# Patient Record
Sex: Female | Born: 1945 | Race: White | Hispanic: No | Marital: Married | Smoking: Former smoker
Health system: Southern US, Community
[De-identification: ages and names within clinical notes are randomized; demographics above are authoritative.]

## PROBLEM LIST (undated history)

## (undated) DIAGNOSIS — I728 Aneurysm of other specified arteries: Secondary | ICD-10-CM

## (undated) DIAGNOSIS — E079 Disorder of thyroid, unspecified: Secondary | ICD-10-CM

## (undated) DIAGNOSIS — I10 Essential (primary) hypertension: Secondary | ICD-10-CM

## (undated) HISTORY — PX: OTHER SURGICAL HISTORY: SHX169

## (undated) HISTORY — DX: Disorder of thyroid, unspecified: E07.9

## (undated) HISTORY — PX: AUGMENTATION MAMMAPLASTY: SUR837

## (undated) HISTORY — DX: Essential (primary) hypertension: I10

## (undated) HISTORY — DX: Aneurysm of other specified arteries: I72.8

## (undated) HISTORY — PX: TONSILLECTOMY: SUR1361

## (undated) HISTORY — PX: ABDOMINAL HYSTERECTOMY: SHX81

---

## 1998-11-05 ENCOUNTER — Other Ambulatory Visit: Admission: RE | Admit: 1998-11-05 | Discharge: 1998-11-05 | Payer: Self-pay | Admitting: Obstetrics and Gynecology

## 1999-01-28 ENCOUNTER — Encounter: Payer: Self-pay | Admitting: Specialist

## 1999-01-28 ENCOUNTER — Ambulatory Visit (HOSPITAL_COMMUNITY): Admission: RE | Admit: 1999-01-28 | Discharge: 1999-01-28 | Payer: Self-pay | Admitting: Specialist

## 2006-11-02 ENCOUNTER — Ambulatory Visit (HOSPITAL_COMMUNITY): Admission: RE | Admit: 2006-11-02 | Discharge: 2006-11-02 | Payer: Self-pay | Admitting: Internal Medicine

## 2011-10-20 DIAGNOSIS — G5760 Lesion of plantar nerve, unspecified lower limb: Secondary | ICD-10-CM | POA: Diagnosis not present

## 2011-11-03 DIAGNOSIS — G5760 Lesion of plantar nerve, unspecified lower limb: Secondary | ICD-10-CM | POA: Diagnosis not present

## 2011-11-19 DIAGNOSIS — Z23 Encounter for immunization: Secondary | ICD-10-CM | POA: Diagnosis not present

## 2011-11-19 DIAGNOSIS — M810 Age-related osteoporosis without current pathological fracture: Secondary | ICD-10-CM | POA: Diagnosis not present

## 2011-11-19 DIAGNOSIS — E559 Vitamin D deficiency, unspecified: Secondary | ICD-10-CM | POA: Diagnosis not present

## 2011-11-19 DIAGNOSIS — E039 Hypothyroidism, unspecified: Secondary | ICD-10-CM | POA: Diagnosis not present

## 2011-11-19 DIAGNOSIS — Z0000 Encounter for general adult medical examination without abnormal findings: Secondary | ICD-10-CM | POA: Diagnosis not present

## 2011-11-19 DIAGNOSIS — Z124 Encounter for screening for malignant neoplasm of cervix: Secondary | ICD-10-CM | POA: Diagnosis not present

## 2011-11-19 DIAGNOSIS — I10 Essential (primary) hypertension: Secondary | ICD-10-CM | POA: Diagnosis not present

## 2011-12-01 DIAGNOSIS — G5760 Lesion of plantar nerve, unspecified lower limb: Secondary | ICD-10-CM | POA: Diagnosis not present

## 2011-12-17 DIAGNOSIS — M810 Age-related osteoporosis without current pathological fracture: Secondary | ICD-10-CM | POA: Diagnosis not present

## 2012-01-18 DIAGNOSIS — R509 Fever, unspecified: Secondary | ICD-10-CM | POA: Diagnosis not present

## 2012-01-18 DIAGNOSIS — J019 Acute sinusitis, unspecified: Secondary | ICD-10-CM | POA: Diagnosis not present

## 2012-02-03 DIAGNOSIS — Z6825 Body mass index (BMI) 25.0-25.9, adult: Secondary | ICD-10-CM | POA: Diagnosis not present

## 2012-02-03 DIAGNOSIS — J019 Acute sinusitis, unspecified: Secondary | ICD-10-CM | POA: Diagnosis not present

## 2012-02-03 DIAGNOSIS — H6120 Impacted cerumen, unspecified ear: Secondary | ICD-10-CM | POA: Diagnosis not present

## 2012-02-03 DIAGNOSIS — E039 Hypothyroidism, unspecified: Secondary | ICD-10-CM | POA: Diagnosis not present

## 2012-02-03 DIAGNOSIS — H902 Conductive hearing loss, unspecified: Secondary | ICD-10-CM | POA: Diagnosis not present

## 2012-03-01 DIAGNOSIS — K644 Residual hemorrhoidal skin tags: Secondary | ICD-10-CM | POA: Diagnosis not present

## 2012-03-30 DIAGNOSIS — Z1211 Encounter for screening for malignant neoplasm of colon: Secondary | ICD-10-CM | POA: Diagnosis not present

## 2012-03-30 DIAGNOSIS — I059 Rheumatic mitral valve disease, unspecified: Secondary | ICD-10-CM | POA: Diagnosis not present

## 2012-03-30 DIAGNOSIS — Z79899 Other long term (current) drug therapy: Secondary | ICD-10-CM | POA: Diagnosis not present

## 2012-03-30 DIAGNOSIS — Z87891 Personal history of nicotine dependence: Secondary | ICD-10-CM | POA: Diagnosis not present

## 2012-03-30 DIAGNOSIS — Z85828 Personal history of other malignant neoplasm of skin: Secondary | ICD-10-CM | POA: Diagnosis not present

## 2012-03-30 DIAGNOSIS — E079 Disorder of thyroid, unspecified: Secondary | ICD-10-CM | POA: Diagnosis not present

## 2012-03-30 DIAGNOSIS — Z7982 Long term (current) use of aspirin: Secondary | ICD-10-CM | POA: Diagnosis not present

## 2012-03-30 DIAGNOSIS — I10 Essential (primary) hypertension: Secondary | ICD-10-CM | POA: Diagnosis not present

## 2012-05-31 DIAGNOSIS — E785 Hyperlipidemia, unspecified: Secondary | ICD-10-CM | POA: Diagnosis not present

## 2012-05-31 DIAGNOSIS — E039 Hypothyroidism, unspecified: Secondary | ICD-10-CM | POA: Diagnosis not present

## 2012-05-31 DIAGNOSIS — I10 Essential (primary) hypertension: Secondary | ICD-10-CM | POA: Diagnosis not present

## 2012-05-31 DIAGNOSIS — Z6825 Body mass index (BMI) 25.0-25.9, adult: Secondary | ICD-10-CM | POA: Diagnosis not present

## 2012-12-01 DIAGNOSIS — E559 Vitamin D deficiency, unspecified: Secondary | ICD-10-CM | POA: Diagnosis not present

## 2012-12-01 DIAGNOSIS — Z0000 Encounter for general adult medical examination without abnormal findings: Secondary | ICD-10-CM | POA: Diagnosis not present

## 2012-12-27 DIAGNOSIS — M810 Age-related osteoporosis without current pathological fracture: Secondary | ICD-10-CM | POA: Diagnosis not present

## 2013-01-12 DIAGNOSIS — R5381 Other malaise: Secondary | ICD-10-CM | POA: Diagnosis not present

## 2013-01-12 DIAGNOSIS — Z1331 Encounter for screening for depression: Secondary | ICD-10-CM | POA: Diagnosis not present

## 2013-01-12 DIAGNOSIS — R5383 Other fatigue: Secondary | ICD-10-CM | POA: Diagnosis not present

## 2013-01-12 DIAGNOSIS — Z6826 Body mass index (BMI) 26.0-26.9, adult: Secondary | ICD-10-CM | POA: Diagnosis not present

## 2013-01-12 DIAGNOSIS — I10 Essential (primary) hypertension: Secondary | ICD-10-CM | POA: Diagnosis not present

## 2013-01-12 DIAGNOSIS — Z9181 History of falling: Secondary | ICD-10-CM | POA: Diagnosis not present

## 2013-01-28 DIAGNOSIS — H18419 Arcus senilis, unspecified eye: Secondary | ICD-10-CM | POA: Diagnosis not present

## 2013-01-28 DIAGNOSIS — H2510 Age-related nuclear cataract, unspecified eye: Secondary | ICD-10-CM | POA: Diagnosis not present

## 2013-01-28 DIAGNOSIS — H25049 Posterior subcapsular polar age-related cataract, unspecified eye: Secondary | ICD-10-CM | POA: Diagnosis not present

## 2013-01-28 DIAGNOSIS — H25019 Cortical age-related cataract, unspecified eye: Secondary | ICD-10-CM | POA: Diagnosis not present

## 2013-01-28 DIAGNOSIS — H02839 Dermatochalasis of unspecified eye, unspecified eyelid: Secondary | ICD-10-CM | POA: Diagnosis not present

## 2013-02-16 DIAGNOSIS — Z85828 Personal history of other malignant neoplasm of skin: Secondary | ICD-10-CM | POA: Diagnosis not present

## 2013-02-28 DIAGNOSIS — H269 Unspecified cataract: Secondary | ICD-10-CM | POA: Diagnosis not present

## 2013-02-28 DIAGNOSIS — H2510 Age-related nuclear cataract, unspecified eye: Secondary | ICD-10-CM | POA: Diagnosis not present

## 2013-03-01 DIAGNOSIS — H2510 Age-related nuclear cataract, unspecified eye: Secondary | ICD-10-CM | POA: Diagnosis not present

## 2013-03-21 DIAGNOSIS — H2510 Age-related nuclear cataract, unspecified eye: Secondary | ICD-10-CM | POA: Diagnosis not present

## 2013-03-21 DIAGNOSIS — H269 Unspecified cataract: Secondary | ICD-10-CM | POA: Diagnosis not present

## 2013-04-27 DIAGNOSIS — E559 Vitamin D deficiency, unspecified: Secondary | ICD-10-CM | POA: Diagnosis not present

## 2013-04-27 DIAGNOSIS — I10 Essential (primary) hypertension: Secondary | ICD-10-CM | POA: Diagnosis not present

## 2013-04-27 DIAGNOSIS — Z6826 Body mass index (BMI) 26.0-26.9, adult: Secondary | ICD-10-CM | POA: Diagnosis not present

## 2013-04-27 DIAGNOSIS — E785 Hyperlipidemia, unspecified: Secondary | ICD-10-CM | POA: Diagnosis not present

## 2013-07-19 DIAGNOSIS — R002 Palpitations: Secondary | ICD-10-CM | POA: Diagnosis not present

## 2013-07-19 DIAGNOSIS — Z79899 Other long term (current) drug therapy: Secondary | ICD-10-CM | POA: Diagnosis not present

## 2013-07-19 DIAGNOSIS — Z7982 Long term (current) use of aspirin: Secondary | ICD-10-CM | POA: Diagnosis not present

## 2013-07-19 DIAGNOSIS — R079 Chest pain, unspecified: Secondary | ICD-10-CM | POA: Diagnosis not present

## 2013-07-19 DIAGNOSIS — R072 Precordial pain: Secondary | ICD-10-CM | POA: Diagnosis not present

## 2013-07-19 DIAGNOSIS — E039 Hypothyroidism, unspecified: Secondary | ICD-10-CM | POA: Diagnosis not present

## 2013-07-19 DIAGNOSIS — M810 Age-related osteoporosis without current pathological fracture: Secondary | ICD-10-CM | POA: Diagnosis not present

## 2013-07-19 DIAGNOSIS — Z0389 Encounter for observation for other suspected diseases and conditions ruled out: Secondary | ICD-10-CM | POA: Diagnosis not present

## 2013-07-19 DIAGNOSIS — I10 Essential (primary) hypertension: Secondary | ICD-10-CM | POA: Diagnosis not present

## 2013-07-20 DIAGNOSIS — I369 Nonrheumatic tricuspid valve disorder, unspecified: Secondary | ICD-10-CM | POA: Diagnosis not present

## 2013-07-20 DIAGNOSIS — I359 Nonrheumatic aortic valve disorder, unspecified: Secondary | ICD-10-CM | POA: Diagnosis not present

## 2013-07-20 DIAGNOSIS — R002 Palpitations: Secondary | ICD-10-CM | POA: Diagnosis not present

## 2013-07-20 DIAGNOSIS — R0789 Other chest pain: Secondary | ICD-10-CM | POA: Diagnosis not present

## 2013-07-20 DIAGNOSIS — E039 Hypothyroidism, unspecified: Secondary | ICD-10-CM | POA: Diagnosis not present

## 2013-07-20 DIAGNOSIS — I059 Rheumatic mitral valve disease, unspecified: Secondary | ICD-10-CM | POA: Diagnosis not present

## 2013-07-20 DIAGNOSIS — R079 Chest pain, unspecified: Secondary | ICD-10-CM | POA: Diagnosis not present

## 2013-07-20 DIAGNOSIS — I10 Essential (primary) hypertension: Secondary | ICD-10-CM | POA: Diagnosis not present

## 2013-07-25 DIAGNOSIS — I10 Essential (primary) hypertension: Secondary | ICD-10-CM | POA: Diagnosis not present

## 2013-07-25 DIAGNOSIS — E039 Hypothyroidism, unspecified: Secondary | ICD-10-CM | POA: Diagnosis not present

## 2013-07-25 DIAGNOSIS — R002 Palpitations: Secondary | ICD-10-CM | POA: Diagnosis not present

## 2013-07-25 DIAGNOSIS — Z6826 Body mass index (BMI) 26.0-26.9, adult: Secondary | ICD-10-CM | POA: Diagnosis not present

## 2013-07-25 DIAGNOSIS — R079 Chest pain, unspecified: Secondary | ICD-10-CM | POA: Diagnosis not present

## 2013-08-05 DIAGNOSIS — R002 Palpitations: Secondary | ICD-10-CM | POA: Diagnosis not present

## 2013-08-08 DIAGNOSIS — Z6826 Body mass index (BMI) 26.0-26.9, adult: Secondary | ICD-10-CM | POA: Diagnosis not present

## 2013-08-08 DIAGNOSIS — R002 Palpitations: Secondary | ICD-10-CM | POA: Diagnosis not present

## 2013-08-08 DIAGNOSIS — K219 Gastro-esophageal reflux disease without esophagitis: Secondary | ICD-10-CM | POA: Diagnosis not present

## 2013-09-07 DIAGNOSIS — J069 Acute upper respiratory infection, unspecified: Secondary | ICD-10-CM | POA: Diagnosis not present

## 2013-09-07 DIAGNOSIS — Z6827 Body mass index (BMI) 27.0-27.9, adult: Secondary | ICD-10-CM | POA: Diagnosis not present

## 2013-09-07 DIAGNOSIS — R002 Palpitations: Secondary | ICD-10-CM | POA: Diagnosis not present

## 2013-09-19 ENCOUNTER — Telehealth: Payer: Self-pay | Admitting: Vascular Surgery

## 2013-09-19 DIAGNOSIS — K7689 Other specified diseases of liver: Secondary | ICD-10-CM | POA: Diagnosis not present

## 2013-09-19 DIAGNOSIS — E039 Hypothyroidism, unspecified: Secondary | ICD-10-CM | POA: Diagnosis not present

## 2013-09-19 DIAGNOSIS — I10 Essential (primary) hypertension: Secondary | ICD-10-CM | POA: Diagnosis not present

## 2013-09-19 DIAGNOSIS — R05 Cough: Secondary | ICD-10-CM | POA: Diagnosis not present

## 2013-09-19 DIAGNOSIS — R0602 Shortness of breath: Secondary | ICD-10-CM | POA: Diagnosis not present

## 2013-09-19 DIAGNOSIS — I728 Aneurysm of other specified arteries: Secondary | ICD-10-CM | POA: Diagnosis not present

## 2013-09-19 DIAGNOSIS — R51 Headache: Secondary | ICD-10-CM | POA: Diagnosis not present

## 2013-09-19 DIAGNOSIS — R112 Nausea with vomiting, unspecified: Secondary | ICD-10-CM | POA: Diagnosis not present

## 2013-09-19 DIAGNOSIS — K8020 Calculus of gallbladder without cholecystitis without obstruction: Secondary | ICD-10-CM | POA: Diagnosis not present

## 2013-09-19 DIAGNOSIS — R918 Other nonspecific abnormal finding of lung field: Secondary | ICD-10-CM | POA: Diagnosis not present

## 2013-09-19 DIAGNOSIS — R1900 Intra-abdominal and pelvic swelling, mass and lump, unspecified site: Secondary | ICD-10-CM | POA: Diagnosis not present

## 2013-09-19 DIAGNOSIS — Z79899 Other long term (current) drug therapy: Secondary | ICD-10-CM | POA: Diagnosis not present

## 2013-09-19 NOTE — Telephone Encounter (Signed)
Spoke with Dr Ilsa Iha at [facility] regarding pt Kristen Holden.  DOB 08/15/2046.  Home phone [phone].  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  Will schedule outpt visit with me in the next 4-6 weeks.  She will need a follow up CT in one year.  Fabienne Bruns, MD [facility] [place] Office: [phone] Pager: [phone]

## 2013-09-21 ENCOUNTER — Telehealth: Payer: Self-pay | Admitting: Vascular Surgery

## 2013-09-21 NOTE — Telephone Encounter (Addendum)
Message copied by Fredrich Birks on Wed Sep 21, 2013 11:16 AM ------      Message from: Fabienne Bruns E      Created: Mon Sep 19, 2013  2:20 PM       Called by [facility] about this pt with 1 cm splenic artery aneurysm.  She needs to bring a copy of the CT images on a CD as well as the report to office visit in 4-6 weeks.  She does not need a repeat CT.  Best number is [phone] home number            Leonette Most ------  09/21/13: spoke with pt and mailed letter, dpm

## 2013-10-19 ENCOUNTER — Encounter: Payer: Self-pay | Admitting: Vascular Surgery

## 2013-10-20 ENCOUNTER — Ambulatory Visit (INDEPENDENT_AMBULATORY_CARE_PROVIDER_SITE_OTHER): Payer: Medicare Other | Admitting: Vascular Surgery

## 2013-10-20 ENCOUNTER — Encounter: Payer: Self-pay | Admitting: Vascular Surgery

## 2013-10-20 VITALS — BP 141/60 | HR 41 | Resp 16 | Ht 67.5 in | Wt 171.0 lb

## 2013-10-20 DIAGNOSIS — I728 Aneurysm of other specified arteries: Secondary | ICD-10-CM | POA: Diagnosis not present

## 2013-10-20 NOTE — Progress Notes (Signed)
[facility] [place] HISTORY AND PHYSICAL   History of Present Illness:  Patient is a 68 y.o. year old female who presents for evaluation of splenic artery aneurysm. The patient was recently seen for an episode of abdominal pain nausea and vomiting at [facility]. CT scan of abdomen and pelvis were obtained which showed possible splenic artery aneurysm. She denies any family history of aneurysms. She currently has no abdominal pain. The nausea and vomiting symptoms that she had previously had resolved. Other medical problems include hypertension and thyroid disease. These are currently stable. She is a former smoker and quit 17 years ago.  Past Medical History  Diagnosis Date  . Hypertension   . Thyroid disease     Past Surgical History  Procedure Laterality Date  . Abdominal hysterectomy    . Breast augumentation Bilateral   . Tonsillectomy      Social History History  Substance Use Topics  . Smoking status: Former Smoker    Quit date: 09/22/1996  . Smokeless tobacco: Not on file  . Alcohol Use: Yes     Comment: 1 drink/ week    Family History Family History  Problem Relation Age of Onset  . Diabetes Mother   . Hypertension Mother   . Varicose Veins Mother   . Cancer Father   . Hypertension Father     Allergies  No Known Allergies   Current Outpatient Prescriptions  Medication Sig Dispense Refill  . aspirin 81 MG tablet Take 81 mg by mouth daily.      . hydrochlorothiazide (HYDRODIURIL) 12.5 MG tablet Take 12.5 mg by mouth daily.      Marland Kitchen levothyroxine (SYNTHROID, LEVOTHROID) 88 MCG tablet Take 88 mcg by mouth daily before breakfast.      . sertraline (ZOLOFT) 100 MG tablet Take 100 mg by mouth daily.      . Vitamin D, Ergocalciferol, (DRISDOL) 50000 UNITS CAPS capsule Take 50,000 Units by mouth every 7 (seven) days.       No current facility-administered medications for this visit.    ROS:   General:  No weight loss, Fever,  chills  HEENT: No recent headaches, no nasal bleeding, no visual changes, no sore throat  Neurologic: No dizziness, blackouts, seizures. No recent symptoms of stroke or mini- stroke. No recent episodes of slurred speech, or temporary blindness.  Cardiac: No recent episodes of chest pain/pressure, no shortness of breath at rest.  No shortness of breath with exertion.  Denies history of atrial fibrillation or irregular heartbeat  Vascular: No history of rest pain in feet.  No history of claudication.  No history of non-healing ulcer, No history of DVT   Pulmonary: No home oxygen, no productive cough, no hemoptysis,  No asthma or wheezing  Musculoskeletal:  [ ]  Arthritis, [ ]  Low back pain,  [ ]  Joint pain  Hematologic:No history of hypercoagulable state.  No history of easy bleeding.  No history of anemia  Gastrointestinal: No hematochezia or melena,  No gastroesophageal reflux, no trouble swallowing  Urinary: [ ]  chronic Kidney disease, [ ]  on HD - [ ]  MWF or [ ]  TTHS, [ ]  Burning with urination, [ ]  Frequent urination, [ ]  Difficulty urinating;   Skin: No rashes  Psychological: No history of anxiety,  No history of depression   Physical Examination  Filed Vitals:   10/20/13 1306  BP: 141/60  Pulse: 41  Resp: 16  Height: 5' 7.5" (1.715 m)  Weight: 171 lb (77.565 kg)  Body mass index is 26.37 kg/(m^2).  General:  Alert and oriented, no acute distress HEENT: Normal Neck: No bruit or JVD Pulmonary: Clear to auscultation bilaterally Cardiac: Regular Rate and Rhythm without murmur Abdomen: Soft, non-tender, non-distended, no mass Skin: No rash Extremity Pulses:  2+ radial, brachial, femoral pulses bilaterally Musculoskeletal: No deformity or edema  Neurologic: Upper and lower extremity motor 5/5 and symmetric  DATA:  CT scan of abdomen and pelvis runoff hospital was reviewed. There are 2 1 cm calcified areas in the hilum of the spleen most likely consistent with splenic  artery aneurysm. One of these has a subtotal occlusion. There is contrast flowing through the other one. There are each 1 cm in diameter. The aorta is of normal diameter.   ASSESSMENT:  Asymptomatic 1 cm splenic artery aneurysm   PLAN:  Followup CT scan in 2 years for evaluation to make sure there is no expansion of the aneurysm. If the aneurysm ever gets to greater than 2 cm we would consider repair at that point. The patient also has a history of bradycardia. She has been evaluated by her primary care physician for this. I discussed with her that she should continue to follow up with her primary care physician regarding this.  Ruta Hinds, MD [facility] [place] Office: [phone] Pager: [phone]

## 2013-12-07 DIAGNOSIS — Z6826 Body mass index (BMI) 26.0-26.9, adult: Secondary | ICD-10-CM | POA: Diagnosis not present

## 2013-12-07 DIAGNOSIS — I10 Essential (primary) hypertension: Secondary | ICD-10-CM | POA: Diagnosis not present

## 2013-12-07 DIAGNOSIS — K219 Gastro-esophageal reflux disease without esophagitis: Secondary | ICD-10-CM | POA: Diagnosis not present

## 2013-12-07 DIAGNOSIS — R002 Palpitations: Secondary | ICD-10-CM | POA: Diagnosis not present

## 2014-01-11 DIAGNOSIS — J309 Allergic rhinitis, unspecified: Secondary | ICD-10-CM | POA: Diagnosis not present

## 2014-01-11 DIAGNOSIS — J019 Acute sinusitis, unspecified: Secondary | ICD-10-CM | POA: Diagnosis not present

## 2014-01-11 DIAGNOSIS — Z6825 Body mass index (BMI) 25.0-25.9, adult: Secondary | ICD-10-CM | POA: Diagnosis not present

## 2014-02-01 DIAGNOSIS — IMO0002 Reserved for concepts with insufficient information to code with codable children: Secondary | ICD-10-CM | POA: Diagnosis not present

## 2014-02-01 DIAGNOSIS — J180 Bronchopneumonia, unspecified organism: Secondary | ICD-10-CM | POA: Diagnosis not present

## 2014-03-13 DIAGNOSIS — Z23 Encounter for immunization: Secondary | ICD-10-CM | POA: Diagnosis not present

## 2014-03-13 DIAGNOSIS — E785 Hyperlipidemia, unspecified: Secondary | ICD-10-CM | POA: Diagnosis not present

## 2014-03-13 DIAGNOSIS — Z0000 Encounter for general adult medical examination without abnormal findings: Secondary | ICD-10-CM | POA: Diagnosis not present

## 2014-03-13 DIAGNOSIS — I10 Essential (primary) hypertension: Secondary | ICD-10-CM | POA: Diagnosis not present

## 2014-03-13 DIAGNOSIS — Z1331 Encounter for screening for depression: Secondary | ICD-10-CM | POA: Diagnosis not present

## 2014-03-13 DIAGNOSIS — IMO0002 Reserved for concepts with insufficient information to code with codable children: Secondary | ICD-10-CM | POA: Diagnosis not present

## 2014-03-13 DIAGNOSIS — E559 Vitamin D deficiency, unspecified: Secondary | ICD-10-CM | POA: Diagnosis not present

## 2014-03-13 DIAGNOSIS — Z9181 History of falling: Secondary | ICD-10-CM | POA: Diagnosis not present

## 2014-03-13 DIAGNOSIS — Z1212 Encounter for screening for malignant neoplasm of rectum: Secondary | ICD-10-CM | POA: Diagnosis not present

## 2014-05-19 DIAGNOSIS — Z6825 Body mass index (BMI) 25.0-25.9, adult: Secondary | ICD-10-CM | POA: Diagnosis not present

## 2014-05-19 DIAGNOSIS — J019 Acute sinusitis, unspecified: Secondary | ICD-10-CM | POA: Diagnosis not present

## 2014-05-19 DIAGNOSIS — H9209 Otalgia, unspecified ear: Secondary | ICD-10-CM | POA: Diagnosis not present

## 2014-05-19 DIAGNOSIS — H6120 Impacted cerumen, unspecified ear: Secondary | ICD-10-CM | POA: Diagnosis not present

## 2014-07-17 DIAGNOSIS — K219 Gastro-esophageal reflux disease without esophagitis: Secondary | ICD-10-CM | POA: Diagnosis not present

## 2014-07-17 DIAGNOSIS — R5381 Other malaise: Secondary | ICD-10-CM | POA: Diagnosis not present

## 2014-07-17 DIAGNOSIS — E559 Vitamin D deficiency, unspecified: Secondary | ICD-10-CM | POA: Diagnosis not present

## 2014-07-17 DIAGNOSIS — J309 Allergic rhinitis, unspecified: Secondary | ICD-10-CM | POA: Diagnosis not present

## 2014-07-17 DIAGNOSIS — M810 Age-related osteoporosis without current pathological fracture: Secondary | ICD-10-CM | POA: Diagnosis not present

## 2014-07-17 DIAGNOSIS — I10 Essential (primary) hypertension: Secondary | ICD-10-CM | POA: Diagnosis not present

## 2014-07-17 DIAGNOSIS — E785 Hyperlipidemia, unspecified: Secondary | ICD-10-CM | POA: Diagnosis not present

## 2014-07-17 DIAGNOSIS — E039 Hypothyroidism, unspecified: Secondary | ICD-10-CM | POA: Diagnosis not present

## 2014-09-25 DIAGNOSIS — Z6826 Body mass index (BMI) 26.0-26.9, adult: Secondary | ICD-10-CM | POA: Diagnosis not present

## 2014-09-25 DIAGNOSIS — J209 Acute bronchitis, unspecified: Secondary | ICD-10-CM | POA: Diagnosis not present

## 2014-10-06 DIAGNOSIS — Z6826 Body mass index (BMI) 26.0-26.9, adult: Secondary | ICD-10-CM | POA: Diagnosis not present

## 2014-10-06 DIAGNOSIS — J209 Acute bronchitis, unspecified: Secondary | ICD-10-CM | POA: Diagnosis not present

## 2014-10-10 DIAGNOSIS — J209 Acute bronchitis, unspecified: Secondary | ICD-10-CM | POA: Diagnosis not present

## 2014-10-10 DIAGNOSIS — Z6826 Body mass index (BMI) 26.0-26.9, adult: Secondary | ICD-10-CM | POA: Diagnosis not present

## 2014-10-19 ENCOUNTER — Encounter: Payer: Self-pay | Admitting: Family

## 2014-10-23 ENCOUNTER — Ambulatory Visit: Payer: Medicare Other | Admitting: Family

## 2015-02-20 DIAGNOSIS — R0602 Shortness of breath: Secondary | ICD-10-CM | POA: Diagnosis not present

## 2015-02-20 DIAGNOSIS — Z6826 Body mass index (BMI) 26.0-26.9, adult: Secondary | ICD-10-CM | POA: Diagnosis not present

## 2015-02-20 DIAGNOSIS — J208 Acute bronchitis due to other specified organisms: Secondary | ICD-10-CM | POA: Diagnosis not present

## 2015-02-28 DIAGNOSIS — Z0279 Encounter for issue of other medical certificate: Secondary | ICD-10-CM

## 2015-03-07 ENCOUNTER — Ambulatory Visit: Payer: Medicare Other | Admitting: Family

## 2015-03-09 ENCOUNTER — Encounter: Payer: Self-pay | Admitting: Family

## 2015-03-14 ENCOUNTER — Encounter: Payer: Self-pay | Admitting: Family

## 2015-03-14 ENCOUNTER — Ambulatory Visit (INDEPENDENT_AMBULATORY_CARE_PROVIDER_SITE_OTHER): Payer: Medicare Other | Admitting: Family

## 2015-03-14 VITALS — BP 145/80 | HR 65 | Resp 16 | Ht 67.0 in | Wt 169.0 lb

## 2015-03-14 DIAGNOSIS — I728 Aneurysm of other specified arteries: Secondary | ICD-10-CM | POA: Diagnosis not present

## 2015-03-14 NOTE — Progress Notes (Signed)
Established Splenic Artery Aneurysm  History of Present Illness  Kristen Holden is a 69 y.o. (1946/08/18) female patient of Dr. Oneida Alar who presents with chief complaint: provider evaluation prior to 2 year CTA follow up of splenic artery aneurysm.   The patient was recently seen for an episode of abdominal pain nausea and vomiting at [facility]. CT scan of abdomen and pelvis were obtained which showed possible splenic artery aneurysm. She denies any family history of aneurysms. She currently has no abdominal pain. The nausea and vomiting symptoms that she had previously had resolved. Other medical problems include hypertension and thyroid disease. These are currently stable. She is a former smoker and quit in 1998.  Dr. Oneida Alar last saw pt on 10/20/13. At that time he reviewed the CT scan of abdomen and pelvis runoff. There were 2, 1 cm calcified areas in the hilum of the spleen most likely consistent with splenic artery aneurysm. One of these has a subtotal occlusion. There is contrast flowing through the other one. These are each 1 cm in diameter. The aorta is of normal diameter. His assessment was asymptomatic 1 cm splenic artery aneurysm. The plan was to schedule a followup CT scan in 2 years for evaluation to make sure there is no expansion of the aneurysm. If the aneurysm ever gets to greater than 2 cm we would consider repair at that point. The patient also has a history of bradycardia. She has been evaluated by her primary care physician for this. Dr. Oneida Alar discussed with her that she should continue to follow up with her primary care physician regarding this.  The patient denies abdominal pain.  The patient notes denies food fear.  The patient denies weight loss. She reports an injury to her anterior/upper/left rib cage area before the splenic aneurysm was detected.   In the 2-3 weeks she has noticed dizziness on standing from sitting, advised to stand up slowly and tos speak with  her PCP re this.  Pt denies any history of stroke or TIA.  She had bronchitis early in 2016 and bronchitis and pneumonia in 2015. She denies kidney problems.  Past Medical History  Diagnosis Date  . Hypertension   . Thyroid disease   . Splenic artery aneurysm     Social History History  Substance Use Topics  . Smoking status: Former Smoker    Quit date: 09/22/1996  . Smokeless tobacco: Never Used  . Alcohol Use: Yes     Comment: 1 drink/ week    Family History Family History  Problem Relation Age of Onset  . Diabetes Mother   . Hypertension Mother   . Varicose Veins Mother   . Cancer Father   . Hypertension Father     Surgical History Past Surgical History  Procedure Laterality Date  . Abdominal hysterectomy    . Breast augumentation Bilateral   . Tonsillectomy      No Known Allergies  Current Outpatient Prescriptions  Medication Sig Dispense Refill  . aspirin 81 MG tablet Take 81 mg by mouth daily.    . hydrochlorothiazide (HYDRODIURIL) 12.5 MG tablet Take 12.5 mg by mouth daily.    Marland Kitchen levothyroxine (SYNTHROID, LEVOTHROID) 88 MCG tablet Take 88 mcg by mouth daily before breakfast.    . omeprazole (PRILOSEC) 20 MG capsule TK ONE C PO  QD  4  . sertraline (ZOLOFT) 100 MG tablet Take 100 mg by mouth daily.    . Vitamin D, Ergocalciferol, (DRISDOL) 50000 UNITS CAPS capsule  Take 50,000 Units by mouth every 7 (seven) days.    . montelukast (SINGULAIR) 10 MG tablet TK 1 T PO D FOR CONGESTION  6   No current facility-administered medications for this visit.    ROS:   General: No weight loss, Fever, chills  HEENT: No recent headaches, no nasal bleeding, no visual changes, no sore throat  Neurologic: No blackouts, seizures. No recent symptoms of stroke or mini- stroke. No recent episodes of slurred speech, or temporary blindness.  Cardiac: No recent episodes of chest pain/pressure, no shortness of breath at rest. No shortness of breath with exertion. Denies  history of atrial fibrillation or irregular heartbeat  Vascular: No history of rest pain in feet. No history of claudication. No history of non-healing ulcer, No history of DVT   Pulmonary: No home oxygen, no productive cough, no hemoptysis, No asthma or wheezing. See HPI.   Musculoskeletal:  Denies Aathritis,  low back pain,joint pain  Hematologic: No history of hypercoagulable state. No history of easy bleeding. No history of anemia  Gastrointestinal: No hematochezia or melena, No gastroesophageal reflux, no trouble swallowing  Urinary: Denies chronic Kidney disease, burning with urination, frequent urination, difficulty urinating;   Skin: No rashes  Psychological: No history of anxiety, No history of depression   Physical Examination  Filed Vitals:   03/14/15 0935  BP: 145/80  Pulse: 65  Resp: 16  Height: 5\' 7"  (1.702 m)  Weight: 169 lb (76.658 kg)  SpO2: 100%   Body mass index is 26.46 kg/(m^2).  General: A&O x 3, WDWN.  Pulmonary: Sym exp, good air movt, CTAB, no rales, rhonchi, or wheezing.  Cardiac: RRR, Nl S1, S2, no detected Murmur.  Vascular: Vessel Right Left  Radial 2+Palpable 2+Palpable  Ulnar Not Palpable Not Palpable  Brachial 2+Palpable 2+Palpable  Carotid  audible without bruit  audible without bruit  Aorta  palpable N/A  Femoral 2+Palpable Faintly Palpable  Popliteal Not palpable Not palpable  PT 2+Palpable Not Palpable  DP 2+Palpable 2+Palpable   Gastrointestinal: soft, NTND, -G/R, - HSM, - masses, - CVAT.  Musculoskeletal: M/S 5/5 throughout, Extremities without ischemic changes.  Neurologic: Pain and light touch intact in extremities, Motor exam as listed above. CN 2-12 intact. Transient light headedness from supine to sitting position.     Medical Decision Making  Kristen Holden is a 69 y.o. female who presents for medical evaluation prior to CTA abd/pelvis approval by her insurance; 2 year CTA follow up of splenic artery  aneurysm.  Last CTA abdomen/pelvis was 10/20/2012, will schedule pt for repeat CTA in December 2016 for evaluation of splenic artery aneurysm, and follow up with Dr. Oneida Alar.  Pt is advised to follow up with her PCP re her light headedness on standing, to stand up more slowly, and to drink more water; her reported fluid intake is low.   Thank you for allowing [place] to participate in this patient's care.  Clemon Chambers, RN, MSN, FNP-C [facility] [place] Office: [phone]  Clinic MD: Scot Dock  03/14/2015, 9:59 AM

## 2015-03-14 NOTE — Addendum Note (Signed)
Addended by: Dorthula Rue L on: 03/14/2015 03:11 PM   Modules accepted: Orders

## 2015-03-15 ENCOUNTER — Telehealth: Payer: Self-pay | Admitting: *Deleted

## 2015-03-15 NOTE — Telephone Encounter (Signed)
Was asked to call this patient re: Sclerotherapy. Told her about the procedure, stockings, pricing, etc. Patient will wait until the fall for her tx. She has my phone number.

## 2015-04-16 DIAGNOSIS — M8589 Other specified disorders of bone density and structure, multiple sites: Secondary | ICD-10-CM | POA: Diagnosis not present

## 2015-04-16 DIAGNOSIS — M818 Other osteoporosis without current pathological fracture: Secondary | ICD-10-CM | POA: Diagnosis not present

## 2015-04-24 ENCOUNTER — Other Ambulatory Visit: Payer: Self-pay

## 2015-04-24 DIAGNOSIS — Z1231 Encounter for screening mammogram for malignant neoplasm of breast: Secondary | ICD-10-CM

## 2015-04-25 DIAGNOSIS — I728 Aneurysm of other specified arteries: Secondary | ICD-10-CM | POA: Diagnosis not present

## 2015-04-25 DIAGNOSIS — Z6826 Body mass index (BMI) 26.0-26.9, adult: Secondary | ICD-10-CM | POA: Diagnosis not present

## 2015-04-25 DIAGNOSIS — R233 Spontaneous ecchymoses: Secondary | ICD-10-CM | POA: Diagnosis not present

## 2015-04-25 DIAGNOSIS — T148 Other injury of unspecified body region: Secondary | ICD-10-CM | POA: Diagnosis not present

## 2015-05-10 DIAGNOSIS — F419 Anxiety disorder, unspecified: Secondary | ICD-10-CM | POA: Diagnosis not present

## 2015-05-10 DIAGNOSIS — Z5320 Procedure and treatment not carried out because of patient's decision for unspecified reasons: Secondary | ICD-10-CM | POA: Diagnosis not present

## 2015-05-10 DIAGNOSIS — R079 Chest pain, unspecified: Secondary | ICD-10-CM | POA: Diagnosis not present

## 2015-05-10 DIAGNOSIS — R072 Precordial pain: Secondary | ICD-10-CM | POA: Diagnosis not present

## 2015-05-10 DIAGNOSIS — R0789 Other chest pain: Secondary | ICD-10-CM | POA: Diagnosis not present

## 2015-06-04 ENCOUNTER — Ambulatory Visit
Admission: RE | Admit: 2015-06-04 | Discharge: 2015-06-04 | Disposition: A | Discharge status: Home / Self Care | Payer: Medicare Other | Source: Ambulatory Visit

## 2015-06-04 DIAGNOSIS — Z1231 Encounter for screening mammogram for malignant neoplasm of breast: Secondary | ICD-10-CM

## 2015-06-20 DIAGNOSIS — E785 Hyperlipidemia, unspecified: Secondary | ICD-10-CM | POA: Diagnosis not present

## 2015-06-20 DIAGNOSIS — H9192 Unspecified hearing loss, left ear: Secondary | ICD-10-CM | POA: Diagnosis not present

## 2015-06-20 DIAGNOSIS — Z9181 History of falling: Secondary | ICD-10-CM | POA: Diagnosis not present

## 2015-06-20 DIAGNOSIS — Z6826 Body mass index (BMI) 26.0-26.9, adult: Secondary | ICD-10-CM | POA: Diagnosis not present

## 2015-06-20 DIAGNOSIS — E039 Hypothyroidism, unspecified: Secondary | ICD-10-CM | POA: Diagnosis not present

## 2015-06-20 DIAGNOSIS — I10 Essential (primary) hypertension: Secondary | ICD-10-CM | POA: Diagnosis not present

## 2015-06-20 DIAGNOSIS — E559 Vitamin D deficiency, unspecified: Secondary | ICD-10-CM | POA: Diagnosis not present

## 2015-08-30 DIAGNOSIS — I728 Aneurysm of other specified arteries: Secondary | ICD-10-CM | POA: Diagnosis not present

## 2015-09-05 ENCOUNTER — Ambulatory Visit
Admission: RE | Admit: 2015-09-05 | Discharge: 2015-09-05 | Disposition: A | Discharge status: Home / Self Care | Payer: Medicare Other | Source: Ambulatory Visit | Attending: Family | Admitting: Family

## 2015-09-05 DIAGNOSIS — I728 Aneurysm of other specified arteries: Secondary | ICD-10-CM | POA: Diagnosis not present

## 2015-09-05 MED ORDER — IOPAMIDOL (ISOVUE-370) INJECTION 76%
75.0000 mL | Freq: Once | INTRAVENOUS | Status: AC | PRN
  Administered 2015-09-05: 75 mL via INTRAVENOUS

## 2015-09-07 ENCOUNTER — Encounter: Payer: Self-pay | Admitting: Vascular Surgery

## 2015-09-13 ENCOUNTER — Ambulatory Visit: Payer: Medicare Other | Admitting: Vascular Surgery

## 2015-09-28 ENCOUNTER — Encounter: Payer: Self-pay | Admitting: Vascular Surgery

## 2015-10-04 ENCOUNTER — Ambulatory Visit (INDEPENDENT_AMBULATORY_CARE_PROVIDER_SITE_OTHER): Payer: Medicare Other | Admitting: Vascular Surgery

## 2015-10-04 VITALS — BP 131/83 | HR 75 | Ht 67.0 in | Wt 171.3 lb

## 2015-10-04 DIAGNOSIS — I728 Aneurysm of other specified arteries: Secondary | ICD-10-CM | POA: Diagnosis not present

## 2015-10-04 NOTE — Progress Notes (Signed)
[facility] [place] HISTORY AND PHYSICAL   History of Present Illness:  Patient is a 70 y.o. year old female who presents for evaluation of splenic artery aneurysm.  The patient was seen for an episode of abdominal pain nausea and vomiting at [facility] about 2 years ago. CT scan of abdomen and pelvis were obtained which showed possible splenic artery aneurysm. She denies any family history of aneurysms. She currently has no abdominal pain. The nausea and vomiting symptoms that she had previously had resolved. She returns today after recent follow-up CT scan. Other medical problems include hypertension and thyroid disease. These are currently stable. She is a former smoker and quit 17 years ago.    Past Medical History   Diagnosis  Date   .  Hypertension     .  Thyroid disease         Past Surgical History   Procedure  Laterality  Date   .  Abdominal hysterectomy       .  Breast augumentation  Bilateral     .  Tonsillectomy         Social History History   Substance Use Topics   .  Smoking status:  Former Smoker       Quit date:  09/22/1996   .  Smokeless tobacco:  Not on file   .  Alcohol Use:  Yes         Comment: 1 drink/ week     Family History Family History   Problem  Relation  Age of Onset   .  Diabetes  Mother     .  Hypertension  Mother     .  Varicose Veins  Mother     .  Cancer  Father     .  Hypertension  Father       Allergies  No Known Allergies     Current Outpatient Prescriptions on File Prior to Visit  Medication Sig Dispense Refill  . aspirin 81 MG tablet Take 81 mg by mouth daily.    . hydrochlorothiazide (HYDRODIURIL) 12.5 MG tablet Take 12.5 mg by mouth daily.    Marland Kitchen levothyroxine (SYNTHROID, LEVOTHROID) 88 MCG tablet Take 88 mcg by mouth daily before breakfast.    . montelukast (SINGULAIR) 10 MG tablet TK 1 T PO D FOR CONGESTION  6  . omeprazole (PRILOSEC) 20 MG capsule TK ONE C PO  QD  4  . sertraline (ZOLOFT) 100 MG  tablet Take 100 mg by mouth daily.    . Vitamin D, Ergocalciferol, (DRISDOL) 50000 UNITS CAPS capsule Take 50,000 Units by mouth every 7 (seven) days.     No current facility-administered medications on file prior to visit.    ROS:    General:  No weight loss, Fever, chills  Cardiac: No recent episodes of chest pain/pressure, no shortness of breath at rest.  No shortness of breath with exertion.  Denies history of atrial fibrillation or irregular heartbeat  Musculoskeletal:  [ ]  Arthritis, [ ]  Low back pain,  [ ]  Joint pain   Physical Examination    Filed Vitals:   10/04/15 0926  BP: 131/83  Pulse: 75  Height: 5\' 7"  (1.702 m)  Weight: 171 lb 4.8 oz (77.701 kg)  SpO2: 98%   General:  Alert and oriented, no acute distress HEENT: Normal Neck: No bruit or JVD Cardiac: Regular Rate and Rhythm without murmur Abdomen: Soft, non-tender, non-distended, no mass Skin: No rash Extremity Pulses:  2+ radial, brachial, femoral pulses bilaterally Musculoskeletal: No deformity or edema     Neurologic: Upper and lower extremity motor 5/5 and symmetric  DATA:  CT scan of abdomen and pelvis runoff hospital was reviewed. There are 2 1 cm calcified areas in the hilum of the spleen most likely consistent with splenic artery aneurysm. One of these has a subtotal occlusion. There is contrast flowing through the other one. There are each 1 cm in diameter. The aorta is of normal diameter. This is unchanged from her previous CT in 2014.   ASSESSMENT:  Asymptomatic 1 cm splenic artery aneurysm   PLAN:  Followup CT scan in 5 years for evaluation to make sure there is no expansion of the aneurysm. If the aneurysm ever gets to greater than 2 cm we would consider repair at that point. She will try to check her blood pressure daily and keep a log to make sure that her hypertension is well controlled.  Kristen Hinds, MD [facility] [place] Office: [phone] Pager:  [phone]

## 2015-10-23 DIAGNOSIS — E039 Hypothyroidism, unspecified: Secondary | ICD-10-CM | POA: Diagnosis not present

## 2015-10-23 DIAGNOSIS — E559 Vitamin D deficiency, unspecified: Secondary | ICD-10-CM | POA: Diagnosis not present

## 2015-10-23 DIAGNOSIS — I10 Essential (primary) hypertension: Secondary | ICD-10-CM | POA: Diagnosis not present

## 2015-10-23 DIAGNOSIS — R5381 Other malaise: Secondary | ICD-10-CM | POA: Diagnosis not present

## 2015-10-23 DIAGNOSIS — Z6826 Body mass index (BMI) 26.0-26.9, adult: Secondary | ICD-10-CM | POA: Diagnosis not present

## 2016-02-06 DIAGNOSIS — H60542 Acute eczematoid otitis externa, left ear: Secondary | ICD-10-CM | POA: Diagnosis not present

## 2016-02-11 DIAGNOSIS — E039 Hypothyroidism, unspecified: Secondary | ICD-10-CM | POA: Diagnosis not present

## 2016-02-11 DIAGNOSIS — F411 Generalized anxiety disorder: Secondary | ICD-10-CM | POA: Diagnosis not present

## 2016-02-11 DIAGNOSIS — I10 Essential (primary) hypertension: Secondary | ICD-10-CM | POA: Diagnosis not present

## 2016-02-11 DIAGNOSIS — E559 Vitamin D deficiency, unspecified: Secondary | ICD-10-CM | POA: Diagnosis not present

## 2016-02-11 DIAGNOSIS — Z1389 Encounter for screening for other disorder: Secondary | ICD-10-CM | POA: Diagnosis not present

## 2016-02-11 DIAGNOSIS — Z6826 Body mass index (BMI) 26.0-26.9, adult: Secondary | ICD-10-CM | POA: Diagnosis not present

## 2016-02-11 DIAGNOSIS — E785 Hyperlipidemia, unspecified: Secondary | ICD-10-CM | POA: Diagnosis not present

## 2016-02-11 DIAGNOSIS — E663 Overweight: Secondary | ICD-10-CM | POA: Diagnosis not present

## 2016-02-16 DIAGNOSIS — S0990XA Unspecified injury of head, initial encounter: Secondary | ICD-10-CM | POA: Diagnosis not present

## 2016-02-16 DIAGNOSIS — I10 Essential (primary) hypertension: Secondary | ICD-10-CM | POA: Diagnosis not present

## 2016-02-16 DIAGNOSIS — S299XXA Unspecified injury of thorax, initial encounter: Secondary | ICD-10-CM | POA: Diagnosis not present

## 2016-02-16 DIAGNOSIS — W19XXXA Unspecified fall, initial encounter: Secondary | ICD-10-CM | POA: Diagnosis not present

## 2016-02-16 DIAGNOSIS — I509 Heart failure, unspecified: Secondary | ICD-10-CM | POA: Diagnosis not present

## 2016-02-16 DIAGNOSIS — S6990XA Unspecified injury of unspecified wrist, hand and finger(s), initial encounter: Secondary | ICD-10-CM | POA: Diagnosis not present

## 2016-02-16 DIAGNOSIS — M25552 Pain in left hip: Secondary | ICD-10-CM | POA: Diagnosis not present

## 2016-02-16 DIAGNOSIS — R51 Headache: Secondary | ICD-10-CM | POA: Diagnosis not present

## 2016-02-16 DIAGNOSIS — R079 Chest pain, unspecified: Secondary | ICD-10-CM | POA: Diagnosis not present

## 2016-02-16 DIAGNOSIS — S022XXA Fracture of nasal bones, initial encounter for closed fracture: Secondary | ICD-10-CM | POA: Diagnosis not present

## 2016-02-16 DIAGNOSIS — G8911 Acute pain due to trauma: Secondary | ICD-10-CM | POA: Diagnosis not present

## 2016-02-16 DIAGNOSIS — W1809XA Striking against other object with subsequent fall, initial encounter: Secondary | ICD-10-CM | POA: Diagnosis not present

## 2016-02-16 DIAGNOSIS — S52515A Nondisplaced fracture of left radial styloid process, initial encounter for closed fracture: Secondary | ICD-10-CM | POA: Diagnosis not present

## 2016-02-16 DIAGNOSIS — W010XXA Fall on same level from slipping, tripping and stumbling without subsequent striking against object, initial encounter: Secondary | ICD-10-CM | POA: Diagnosis not present

## 2016-02-16 DIAGNOSIS — S065X0A Traumatic subdural hemorrhage without loss of consciousness, initial encounter: Secondary | ICD-10-CM | POA: Diagnosis not present

## 2016-02-16 DIAGNOSIS — S0181XA Laceration without foreign body of other part of head, initial encounter: Secondary | ICD-10-CM | POA: Diagnosis not present

## 2016-02-16 DIAGNOSIS — S52502A Unspecified fracture of the lower end of left radius, initial encounter for closed fracture: Secondary | ICD-10-CM | POA: Diagnosis not present

## 2016-02-16 DIAGNOSIS — E039 Hypothyroidism, unspecified: Secondary | ICD-10-CM | POA: Diagnosis not present

## 2016-02-16 DIAGNOSIS — R072 Precordial pain: Secondary | ICD-10-CM | POA: Diagnosis not present

## 2016-02-16 DIAGNOSIS — S52602A Unspecified fracture of lower end of left ulna, initial encounter for closed fracture: Secondary | ICD-10-CM | POA: Diagnosis not present

## 2016-02-16 DIAGNOSIS — S52572A Other intraarticular fracture of lower end of left radius, initial encounter for closed fracture: Secondary | ICD-10-CM | POA: Diagnosis not present

## 2016-02-16 DIAGNOSIS — S0083XA Contusion of other part of head, initial encounter: Secondary | ICD-10-CM | POA: Diagnosis not present

## 2016-02-16 DIAGNOSIS — S065X9A Traumatic subdural hemorrhage with loss of consciousness of unspecified duration, initial encounter: Secondary | ICD-10-CM | POA: Diagnosis not present

## 2016-02-17 DIAGNOSIS — E039 Hypothyroidism, unspecified: Secondary | ICD-10-CM | POA: Diagnosis present

## 2016-02-17 DIAGNOSIS — S0181XA Laceration without foreign body of other part of head, initial encounter: Secondary | ICD-10-CM | POA: Diagnosis not present

## 2016-02-17 DIAGNOSIS — S52602A Unspecified fracture of lower end of left ulna, initial encounter for closed fracture: Secondary | ICD-10-CM | POA: Diagnosis present

## 2016-02-17 DIAGNOSIS — R072 Precordial pain: Secondary | ICD-10-CM | POA: Diagnosis not present

## 2016-02-17 DIAGNOSIS — W010XXA Fall on same level from slipping, tripping and stumbling without subsequent striking against object, initial encounter: Secondary | ICD-10-CM | POA: Diagnosis not present

## 2016-02-17 DIAGNOSIS — K219 Gastro-esophageal reflux disease without esophagitis: Secondary | ICD-10-CM | POA: Diagnosis present

## 2016-02-17 DIAGNOSIS — W19XXXA Unspecified fall, initial encounter: Secondary | ICD-10-CM | POA: Diagnosis not present

## 2016-02-17 DIAGNOSIS — I6200 Nontraumatic subdural hemorrhage, unspecified: Secondary | ICD-10-CM | POA: Diagnosis not present

## 2016-02-17 DIAGNOSIS — S060X9A Concussion with loss of consciousness of unspecified duration, initial encounter: Secondary | ICD-10-CM | POA: Diagnosis present

## 2016-02-17 DIAGNOSIS — S065X9A Traumatic subdural hemorrhage with loss of consciousness of unspecified duration, initial encounter: Secondary | ICD-10-CM | POA: Diagnosis present

## 2016-02-17 DIAGNOSIS — S52502A Unspecified fracture of the lower end of left radius, initial encounter for closed fracture: Secondary | ICD-10-CM | POA: Diagnosis present

## 2016-02-17 DIAGNOSIS — S0083XA Contusion of other part of head, initial encounter: Secondary | ICD-10-CM | POA: Diagnosis present

## 2016-02-17 DIAGNOSIS — S022XXA Fracture of nasal bones, initial encounter for closed fracture: Secondary | ICD-10-CM | POA: Diagnosis present

## 2016-02-17 DIAGNOSIS — Z7982 Long term (current) use of aspirin: Secondary | ICD-10-CM | POA: Diagnosis not present

## 2016-02-17 DIAGNOSIS — R51 Headache: Secondary | ICD-10-CM | POA: Diagnosis not present

## 2016-02-17 DIAGNOSIS — I10 Essential (primary) hypertension: Secondary | ICD-10-CM | POA: Diagnosis present

## 2016-02-17 DIAGNOSIS — Z90710 Acquired absence of both cervix and uterus: Secondary | ICD-10-CM | POA: Diagnosis not present

## 2016-02-17 DIAGNOSIS — S52572A Other intraarticular fracture of lower end of left radius, initial encounter for closed fracture: Secondary | ICD-10-CM | POA: Diagnosis not present

## 2016-02-17 DIAGNOSIS — M25552 Pain in left hip: Secondary | ICD-10-CM | POA: Diagnosis present

## 2016-02-17 DIAGNOSIS — Z87891 Personal history of nicotine dependence: Secondary | ICD-10-CM | POA: Diagnosis not present

## 2016-02-25 DIAGNOSIS — S60221A Contusion of right hand, initial encounter: Secondary | ICD-10-CM | POA: Diagnosis not present

## 2016-02-25 DIAGNOSIS — S065X1A Traumatic subdural hemorrhage with loss of consciousness of 30 minutes or less, initial encounter: Secondary | ICD-10-CM | POA: Diagnosis not present

## 2016-02-25 DIAGNOSIS — I10 Essential (primary) hypertension: Secondary | ICD-10-CM | POA: Diagnosis not present

## 2016-02-25 DIAGNOSIS — S022XXS Fracture of nasal bones, sequela: Secondary | ICD-10-CM | POA: Diagnosis not present

## 2016-02-25 DIAGNOSIS — S060X1A Concussion with loss of consciousness of 30 minutes or less, initial encounter: Secondary | ICD-10-CM | POA: Diagnosis not present

## 2016-02-25 DIAGNOSIS — S62102S Fracture of unspecified carpal bone, left wrist, sequela: Secondary | ICD-10-CM | POA: Diagnosis not present

## 2016-02-25 DIAGNOSIS — S60211A Contusion of right wrist, initial encounter: Secondary | ICD-10-CM | POA: Diagnosis not present

## 2016-02-25 DIAGNOSIS — S022XXA Fracture of nasal bones, initial encounter for closed fracture: Secondary | ICD-10-CM | POA: Diagnosis not present

## 2016-02-25 DIAGNOSIS — S0083XA Contusion of other part of head, initial encounter: Secondary | ICD-10-CM | POA: Diagnosis not present

## 2016-02-25 DIAGNOSIS — S52572A Other intraarticular fracture of lower end of left radius, initial encounter for closed fracture: Secondary | ICD-10-CM | POA: Diagnosis not present

## 2016-02-25 DIAGNOSIS — M25532 Pain in left wrist: Secondary | ICD-10-CM | POA: Diagnosis not present

## 2016-02-25 DIAGNOSIS — M950 Acquired deformity of nose: Secondary | ICD-10-CM | POA: Diagnosis not present

## 2016-02-28 DIAGNOSIS — Y939 Activity, unspecified: Secondary | ICD-10-CM | POA: Diagnosis not present

## 2016-02-28 DIAGNOSIS — Y999 Unspecified external cause status: Secondary | ICD-10-CM | POA: Diagnosis not present

## 2016-02-28 DIAGNOSIS — S52572A Other intraarticular fracture of lower end of left radius, initial encounter for closed fracture: Secondary | ICD-10-CM | POA: Diagnosis not present

## 2016-03-06 DIAGNOSIS — S022XXA Fracture of nasal bones, initial encounter for closed fracture: Secondary | ICD-10-CM | POA: Diagnosis not present

## 2016-03-10 DIAGNOSIS — H524 Presbyopia: Secondary | ICD-10-CM | POA: Diagnosis not present

## 2016-03-10 DIAGNOSIS — S065X1A Traumatic subdural hemorrhage with loss of consciousness of 30 minutes or less, initial encounter: Secondary | ICD-10-CM | POA: Diagnosis not present

## 2016-03-10 DIAGNOSIS — G44319 Acute post-traumatic headache, not intractable: Secondary | ICD-10-CM | POA: Diagnosis not present

## 2016-03-10 DIAGNOSIS — H04123 Dry eye syndrome of bilateral lacrimal glands: Secondary | ICD-10-CM | POA: Diagnosis not present

## 2016-03-10 DIAGNOSIS — S62102S Fracture of unspecified carpal bone, left wrist, sequela: Secondary | ICD-10-CM | POA: Diagnosis not present

## 2016-03-12 DIAGNOSIS — Z4789 Encounter for other orthopedic aftercare: Secondary | ICD-10-CM | POA: Diagnosis not present

## 2016-03-12 DIAGNOSIS — S065X1A Traumatic subdural hemorrhage with loss of consciousness of 30 minutes or less, initial encounter: Secondary | ICD-10-CM | POA: Diagnosis not present

## 2016-03-12 DIAGNOSIS — I6200 Nontraumatic subdural hemorrhage, unspecified: Secondary | ICD-10-CM | POA: Diagnosis not present

## 2016-03-19 ENCOUNTER — Other Ambulatory Visit: Payer: Self-pay | Admitting: Specialist

## 2016-03-19 ENCOUNTER — Other Ambulatory Visit: Payer: Self-pay

## 2016-03-19 DIAGNOSIS — T8543XA Leakage of breast prosthesis and implant, initial encounter: Secondary | ICD-10-CM

## 2016-03-19 DIAGNOSIS — N6459 Other signs and symptoms in breast: Secondary | ICD-10-CM

## 2016-03-21 ENCOUNTER — Ambulatory Visit
Admission: RE | Admit: 2016-03-21 | Discharge: 2016-03-21 | Disposition: A | Discharge status: Home / Self Care | Payer: Medicare Other | Source: Ambulatory Visit | Attending: Specialist | Admitting: Specialist

## 2016-03-21 ENCOUNTER — Other Ambulatory Visit: Payer: Self-pay | Admitting: Specialist

## 2016-03-21 DIAGNOSIS — T8543XA Leakage of breast prosthesis and implant, initial encounter: Secondary | ICD-10-CM

## 2016-03-21 DIAGNOSIS — N6459 Other signs and symptoms in breast: Secondary | ICD-10-CM

## 2016-03-21 DIAGNOSIS — R928 Other abnormal and inconclusive findings on diagnostic imaging of breast: Secondary | ICD-10-CM | POA: Diagnosis not present

## 2016-03-26 DIAGNOSIS — S52572D Other intraarticular fracture of lower end of left radius, subsequent encounter for closed fracture with routine healing: Secondary | ICD-10-CM | POA: Diagnosis not present

## 2016-03-26 DIAGNOSIS — S022XXA Fracture of nasal bones, initial encounter for closed fracture: Secondary | ICD-10-CM | POA: Diagnosis not present

## 2016-03-31 DIAGNOSIS — S52572D Other intraarticular fracture of lower end of left radius, subsequent encounter for closed fracture with routine healing: Secondary | ICD-10-CM | POA: Diagnosis not present

## 2016-04-03 DIAGNOSIS — S52572D Other intraarticular fracture of lower end of left radius, subsequent encounter for closed fracture with routine healing: Secondary | ICD-10-CM | POA: Diagnosis not present

## 2016-04-07 DIAGNOSIS — S52572D Other intraarticular fracture of lower end of left radius, subsequent encounter for closed fracture with routine healing: Secondary | ICD-10-CM | POA: Diagnosis not present

## 2016-04-11 DIAGNOSIS — S52572D Other intraarticular fracture of lower end of left radius, subsequent encounter for closed fracture with routine healing: Secondary | ICD-10-CM | POA: Diagnosis not present

## 2016-04-11 DIAGNOSIS — Z4789 Encounter for other orthopedic aftercare: Secondary | ICD-10-CM | POA: Diagnosis not present

## 2016-04-21 DIAGNOSIS — S52572D Other intraarticular fracture of lower end of left radius, subsequent encounter for closed fracture with routine healing: Secondary | ICD-10-CM | POA: Diagnosis not present

## 2016-04-24 DIAGNOSIS — S52572D Other intraarticular fracture of lower end of left radius, subsequent encounter for closed fracture with routine healing: Secondary | ICD-10-CM | POA: Diagnosis not present

## 2016-04-28 DIAGNOSIS — Z4789 Encounter for other orthopedic aftercare: Secondary | ICD-10-CM | POA: Diagnosis not present

## 2016-04-29 DIAGNOSIS — S52572D Other intraarticular fracture of lower end of left radius, subsequent encounter for closed fracture with routine healing: Secondary | ICD-10-CM | POA: Diagnosis not present

## 2016-05-01 DIAGNOSIS — S52572D Other intraarticular fracture of lower end of left radius, subsequent encounter for closed fracture with routine healing: Secondary | ICD-10-CM | POA: Diagnosis not present

## 2016-05-05 DIAGNOSIS — S52572D Other intraarticular fracture of lower end of left radius, subsequent encounter for closed fracture with routine healing: Secondary | ICD-10-CM | POA: Diagnosis not present

## 2016-05-07 DIAGNOSIS — T8549XA Other mechanical complication of breast prosthesis and implant, initial encounter: Secondary | ICD-10-CM | POA: Diagnosis not present

## 2016-05-07 DIAGNOSIS — S022XXA Fracture of nasal bones, initial encounter for closed fracture: Secondary | ICD-10-CM | POA: Diagnosis not present

## 2016-05-09 DIAGNOSIS — S52572D Other intraarticular fracture of lower end of left radius, subsequent encounter for closed fracture with routine healing: Secondary | ICD-10-CM | POA: Diagnosis not present

## 2016-05-13 DIAGNOSIS — S52572D Other intraarticular fracture of lower end of left radius, subsequent encounter for closed fracture with routine healing: Secondary | ICD-10-CM | POA: Diagnosis not present

## 2016-05-15 DIAGNOSIS — S52572D Other intraarticular fracture of lower end of left radius, subsequent encounter for closed fracture with routine healing: Secondary | ICD-10-CM | POA: Diagnosis not present

## 2016-05-19 DIAGNOSIS — S52572D Other intraarticular fracture of lower end of left radius, subsequent encounter for closed fracture with routine healing: Secondary | ICD-10-CM | POA: Diagnosis not present

## 2016-05-21 DIAGNOSIS — Z4789 Encounter for other orthopedic aftercare: Secondary | ICD-10-CM | POA: Diagnosis not present

## 2016-05-22 DIAGNOSIS — S52572D Other intraarticular fracture of lower end of left radius, subsequent encounter for closed fracture with routine healing: Secondary | ICD-10-CM | POA: Diagnosis not present

## 2016-05-27 DIAGNOSIS — S52572D Other intraarticular fracture of lower end of left radius, subsequent encounter for closed fracture with routine healing: Secondary | ICD-10-CM | POA: Diagnosis not present

## 2016-05-29 DIAGNOSIS — S52572D Other intraarticular fracture of lower end of left radius, subsequent encounter for closed fracture with routine healing: Secondary | ICD-10-CM | POA: Diagnosis not present

## 2016-06-03 DIAGNOSIS — Z4789 Encounter for other orthopedic aftercare: Secondary | ICD-10-CM | POA: Diagnosis not present

## 2016-06-03 DIAGNOSIS — S52572D Other intraarticular fracture of lower end of left radius, subsequent encounter for closed fracture with routine healing: Secondary | ICD-10-CM | POA: Diagnosis not present

## 2016-06-06 DIAGNOSIS — S52572D Other intraarticular fracture of lower end of left radius, subsequent encounter for closed fracture with routine healing: Secondary | ICD-10-CM | POA: Diagnosis not present

## 2016-06-09 DIAGNOSIS — S52572D Other intraarticular fracture of lower end of left radius, subsequent encounter for closed fracture with routine healing: Secondary | ICD-10-CM | POA: Diagnosis not present

## 2016-06-16 DIAGNOSIS — S52572D Other intraarticular fracture of lower end of left radius, subsequent encounter for closed fracture with routine healing: Secondary | ICD-10-CM | POA: Diagnosis not present

## 2016-06-19 DIAGNOSIS — S52572D Other intraarticular fracture of lower end of left radius, subsequent encounter for closed fracture with routine healing: Secondary | ICD-10-CM | POA: Diagnosis not present

## 2016-06-20 DIAGNOSIS — Z4789 Encounter for other orthopedic aftercare: Secondary | ICD-10-CM | POA: Diagnosis not present

## 2016-06-20 DIAGNOSIS — S52572D Other intraarticular fracture of lower end of left radius, subsequent encounter for closed fracture with routine healing: Secondary | ICD-10-CM | POA: Diagnosis not present

## 2016-06-25 DIAGNOSIS — E559 Vitamin D deficiency, unspecified: Secondary | ICD-10-CM | POA: Diagnosis not present

## 2016-06-25 DIAGNOSIS — I10 Essential (primary) hypertension: Secondary | ICD-10-CM | POA: Diagnosis not present

## 2016-06-25 DIAGNOSIS — F411 Generalized anxiety disorder: Secondary | ICD-10-CM | POA: Diagnosis not present

## 2016-06-25 DIAGNOSIS — G44319 Acute post-traumatic headache, not intractable: Secondary | ICD-10-CM | POA: Diagnosis not present

## 2016-06-25 DIAGNOSIS — Z1231 Encounter for screening mammogram for malignant neoplasm of breast: Secondary | ICD-10-CM | POA: Diagnosis not present

## 2016-06-25 DIAGNOSIS — E039 Hypothyroidism, unspecified: Secondary | ICD-10-CM | POA: Diagnosis not present

## 2016-06-25 DIAGNOSIS — E785 Hyperlipidemia, unspecified: Secondary | ICD-10-CM | POA: Diagnosis not present

## 2016-07-01 ENCOUNTER — Other Ambulatory Visit: Payer: Self-pay | Admitting: Internal Medicine

## 2016-07-01 DIAGNOSIS — Z1231 Encounter for screening mammogram for malignant neoplasm of breast: Secondary | ICD-10-CM

## 2016-10-21 DIAGNOSIS — Z23 Encounter for immunization: Secondary | ICD-10-CM | POA: Diagnosis not present

## 2016-12-01 DIAGNOSIS — Z9181 History of falling: Secondary | ICD-10-CM | POA: Diagnosis not present

## 2016-12-01 DIAGNOSIS — N959 Unspecified menopausal and perimenopausal disorder: Secondary | ICD-10-CM | POA: Diagnosis not present

## 2016-12-01 DIAGNOSIS — Z1389 Encounter for screening for other disorder: Secondary | ICD-10-CM | POA: Diagnosis not present

## 2016-12-01 DIAGNOSIS — Z0000 Encounter for general adult medical examination without abnormal findings: Secondary | ICD-10-CM | POA: Diagnosis not present

## 2016-12-01 DIAGNOSIS — Z136 Encounter for screening for cardiovascular disorders: Secondary | ICD-10-CM | POA: Diagnosis not present

## 2016-12-01 DIAGNOSIS — E785 Hyperlipidemia, unspecified: Secondary | ICD-10-CM | POA: Diagnosis not present

## 2016-12-24 DIAGNOSIS — Z6826 Body mass index (BMI) 26.0-26.9, adult: Secondary | ICD-10-CM | POA: Diagnosis not present

## 2016-12-24 DIAGNOSIS — R6889 Other general symptoms and signs: Secondary | ICD-10-CM | POA: Diagnosis not present

## 2016-12-24 DIAGNOSIS — J208 Acute bronchitis due to other specified organisms: Secondary | ICD-10-CM | POA: Diagnosis not present

## 2017-01-07 DIAGNOSIS — E663 Overweight: Secondary | ICD-10-CM | POA: Diagnosis not present

## 2017-01-07 DIAGNOSIS — I10 Essential (primary) hypertension: Secondary | ICD-10-CM | POA: Diagnosis not present

## 2017-01-07 DIAGNOSIS — E785 Hyperlipidemia, unspecified: Secondary | ICD-10-CM | POA: Diagnosis not present

## 2017-01-07 DIAGNOSIS — E039 Hypothyroidism, unspecified: Secondary | ICD-10-CM | POA: Diagnosis not present

## 2017-01-07 DIAGNOSIS — Z6826 Body mass index (BMI) 26.0-26.9, adult: Secondary | ICD-10-CM | POA: Diagnosis not present

## 2017-01-07 DIAGNOSIS — F411 Generalized anxiety disorder: Secondary | ICD-10-CM | POA: Diagnosis not present

## 2017-01-07 DIAGNOSIS — E559 Vitamin D deficiency, unspecified: Secondary | ICD-10-CM | POA: Diagnosis not present

## 2017-07-22 DIAGNOSIS — E785 Hyperlipidemia, unspecified: Secondary | ICD-10-CM | POA: Diagnosis not present

## 2017-07-22 DIAGNOSIS — F411 Generalized anxiety disorder: Secondary | ICD-10-CM | POA: Diagnosis not present

## 2017-07-22 DIAGNOSIS — K219 Gastro-esophageal reflux disease without esophagitis: Secondary | ICD-10-CM | POA: Diagnosis not present

## 2017-07-22 DIAGNOSIS — Z23 Encounter for immunization: Secondary | ICD-10-CM | POA: Diagnosis not present

## 2017-07-22 DIAGNOSIS — E039 Hypothyroidism, unspecified: Secondary | ICD-10-CM | POA: Diagnosis not present

## 2017-07-22 DIAGNOSIS — I10 Essential (primary) hypertension: Secondary | ICD-10-CM | POA: Diagnosis not present

## 2017-11-16 DIAGNOSIS — J019 Acute sinusitis, unspecified: Secondary | ICD-10-CM | POA: Diagnosis not present

## 2017-11-16 DIAGNOSIS — J309 Allergic rhinitis, unspecified: Secondary | ICD-10-CM | POA: Diagnosis not present

## 2017-11-20 ENCOUNTER — Emergency Department (HOSPITAL_COMMUNITY)
Admission: EM | Admit: 2017-11-20 | Discharge: 2017-11-20 | Disposition: A | Discharge status: Home / Self Care | Payer: Medicare Other | Attending: Emergency Medicine | Admitting: Emergency Medicine

## 2017-11-20 ENCOUNTER — Other Ambulatory Visit: Payer: Self-pay

## 2017-11-20 ENCOUNTER — Emergency Department (HOSPITAL_COMMUNITY): Payer: Medicare Other

## 2017-11-20 ENCOUNTER — Encounter (HOSPITAL_COMMUNITY): Payer: Self-pay

## 2017-11-20 DIAGNOSIS — Z87891 Personal history of nicotine dependence: Secondary | ICD-10-CM | POA: Insufficient documentation

## 2017-11-20 DIAGNOSIS — I10 Essential (primary) hypertension: Secondary | ICD-10-CM | POA: Insufficient documentation

## 2017-11-20 DIAGNOSIS — R002 Palpitations: Secondary | ICD-10-CM | POA: Diagnosis not present

## 2017-11-20 DIAGNOSIS — Z79899 Other long term (current) drug therapy: Secondary | ICD-10-CM | POA: Insufficient documentation

## 2017-11-20 DIAGNOSIS — Z7982 Long term (current) use of aspirin: Secondary | ICD-10-CM | POA: Insufficient documentation

## 2017-11-20 LAB — CBC
HEMATOCRIT: 45.7 % (ref 36.0–46.0)
Hemoglobin: 14.7 g/dL (ref 12.0–15.0)
MCH: 29.8 pg (ref 26.0–34.0)
MCHC: 32.2 g/dL (ref 30.0–36.0)
MCV: 92.5 fL (ref 78.0–100.0)
Platelets: 197 10*3/uL (ref 150–400)
RBC: 4.94 MIL/uL (ref 3.87–5.11)
RDW: 13.9 % (ref 11.5–15.5)
WBC: 7.7 10*3/uL (ref 4.0–10.5)

## 2017-11-20 LAB — TSH: TSH: 3.415 u[IU]/mL (ref 0.350–4.500)

## 2017-11-20 LAB — BASIC METABOLIC PANEL
Anion gap: 9 (ref 5–15)
BUN: 17 mg/dL (ref 6–20)
CO2: 27 mmol/L (ref 22–32)
CREATININE: 1.01 mg/dL — AB (ref 0.44–1.00)
Calcium: 9.5 mg/dL (ref 8.9–10.3)
Chloride: 103 mmol/L (ref 101–111)
GFR calc Af Amer: >60 mL/min (ref >60)
GFR, EST NON AFRICAN AMERICAN: 55 mL/min — AB (ref >60)
Glucose, Bld: 92 mg/dL (ref 65–99)
POTASSIUM: 4.4 mmol/L (ref 3.5–5.1)
SODIUM: 139 mmol/L (ref 135–145)

## 2017-11-20 LAB — I-STAT TROPONIN, ED: Troponin i, poc: 0 ng/mL (ref 0.00–0.08)

## 2017-11-20 NOTE — Discharge Instructions (Signed)
You were evaluated in the emergency department for possibility of atrial fibrillation.  Currently your heart rhythm is called premature atrial contractions.  This is still irregular.  Your blood work was otherwise unremarkable.  Your thyroid test is still pending.  The cardiology clinic should contact you regarding getting you set up with a Holter monitor to follow your heart rhythm.  Please return if you are having any problems.

## 2017-11-20 NOTE — ED Notes (Signed)
ED Provider at bedside. 

## 2017-11-20 NOTE — ED Triage Notes (Signed)
Patient had sinus pressure earlier in week and was told that her heartbeat was different and sent home. A neighbor informed her to come to ED because they thought she might be in atrial fib. No CP, no SOB. No atrial fib on arrival

## 2017-11-20 NOTE — ED Provider Notes (Signed)
[facility] EMERGENCY DEPARTMENT Provider Note   CSN: [id number] Arrival date & time: 11/20/17  1014     History   Chief Complaint No chief complaint on file.   HPI Kristen Holden is a 72 y.o. female.  She is here complaining of possible atrial fibrillation.  She states she went to her doctor on Monday for sinus problems and on exam was noted to have an irregular heartbeat.  Her doctor did not mention much of it placed her on Augmentin.  She states her sinus symptoms have improved since then.  Last evening at a dinner engagement she mentioned the heart problem and 1 of her casts who was a retired physician checked her pulse and told her she was in A. fib.  She is never been told before that she has been in A. fib.  She does endorse that at night she has difficulty lying on one side because she hears her heartbeat and it beats very strong and it troubles her.  She denies that she is having pain.  No other recent illness symptoms other than the sinus with a dry cough  The history is provided by the patient.  Palpitations   This is a new problem. The current episode started more than 1 week ago. The problem occurs constantly. The problem has not changed since onset.Pertinent negatives include no fever, no chest pain, no chest pressure, no syncope, no abdominal pain, no nausea, no vomiting, no headaches, no back pain, no cough and no shortness of breath. She has tried nothing for the symptoms. There are no known risk factors.    Past Medical History:  Diagnosis Date  . Hypertension   . Splenic artery aneurysm ([place])   . Thyroid disease     There are no active problems to display for this patient.   Past Surgical History:  Procedure Laterality Date  . ABDOMINAL HYSTERECTOMY    . breast augumentation Bilateral   . TONSILLECTOMY      OB History    No data available       Home Medications    Prior to Admission medications   Medication Sig Start Date End Date  Taking? Authorizing Provider  aspirin 81 MG tablet Take 81 mg by mouth daily.    [provider]  hydrochlorothiazide (HYDRODIURIL) 12.5 MG tablet Take 12.5 mg by mouth daily.    [provider]  levothyroxine (SYNTHROID, LEVOTHROID) 88 MCG tablet Take 88 mcg by mouth daily before breakfast.    [provider]  montelukast (SINGULAIR) 10 MG tablet TK 1 T PO D FOR CONGESTION 12/28/14   [provider]  omeprazole (PRILOSEC) 20 MG capsule TK ONE C PO  QD 12/28/14   [provider]  sertraline (ZOLOFT) 100 MG tablet Take 100 mg by mouth daily.    [provider]  Vitamin D, Ergocalciferol, (DRISDOL) 50000 UNITS CAPS capsule Take 50,000 Units by mouth every 7 (seven) days.    [provider]    Family History Family History  Problem Relation Age of Onset  . Diabetes Mother   . Hypertension Mother   . Varicose Veins Mother   . Cancer Father   . Hypertension Father     Social History Social History   Tobacco Use  . Smoking status: Former Smoker    Last attempt to quit: 09/22/1996    Years since quitting: 21.1  . Smokeless tobacco: Never Used  Substance Use Topics  . Alcohol use: Yes  Comment: 1 drink/ week  . Drug use: No     Allergies   Patient has no known allergies.   Review of Systems Review of Systems  Constitutional: Negative for chills and fever.  HENT: Negative for ear pain and sore throat.   Eyes: Negative for pain and visual disturbance.  Respiratory: Negative for cough and shortness of breath.   Cardiovascular: Positive for palpitations. Negative for chest pain and syncope.  Gastrointestinal: Negative for abdominal pain, nausea and vomiting.  Genitourinary: Negative for dysuria and hematuria.  Musculoskeletal: Negative for arthralgias and back pain.  Skin: Negative for color change and rash.  Neurological: Negative for seizures, syncope and headaches.  All other systems reviewed and are  negative.    Physical Exam Updated Vital Signs BP (!) 156/68   Pulse 68   Temp 98.2 F (36.8 C) (Oral)   Resp 18   Ht 5\' 7"  (1.702 m)   Wt 79.4 kg (175 lb)   SpO2 99%   BMI 27.41 kg/m   Physical Exam  Constitutional: She appears well-developed and well-nourished. No distress.  HENT:  Head: Normocephalic and atraumatic.  Eyes: Conjunctivae are normal.  Neck: Neck supple.  Cardiovascular: Normal rate and normal pulses. An irregular rhythm present.  No murmur heard. Pulmonary/Chest: Effort normal and breath sounds normal. No respiratory distress.  Abdominal: Soft. There is no tenderness.  Musculoskeletal: Normal range of motion. She exhibits no edema, tenderness or deformity.  Neurological: She is alert.  Skin: Skin is warm and dry.  Psychiatric: She has a normal mood and affect.  Nursing note and vitals reviewed.    ED Treatments / Results  Labs (all labs ordered are listed, but only abnormal results are displayed) Labs Reviewed  BASIC METABOLIC PANEL - Abnormal; Notable for the following components:      Result Value   Creatinine, Ser 1.01 (*)    GFR calc non Af Amer 55 (*)    All other components within normal limits  CBC  TSH  I-STAT TROPONIN, ED    EKG  EKG Interpretation  Date/Time:  Friday November 20 2017 10:25:55 EST Ventricular Rate:  69 PR Interval:  166 QRS Duration: 62 QT Interval:  398 QTC Calculation: 426 R Axis:   62 Text Interpretation:  Sinus rhythm with Premature atrial complexes Possible Anterior infarct , age undetermined Abnormal ECG no prior no compare with Confirmed by Aletta Edouard [phone]) on 11/20/2017 1:19:55 PM       Radiology Dg Chest 2 View  Result Date: 11/20/2017 CLINICAL DATA:  Possibly in A-fibHx of Mitral Valve Prolapse, HTNPt was recently Dx with a sinus infection, and was told at the time that her heart beat was irregular, but that it was normal for her. Her neighbor, who is a retired physician felt her heart rate, and  informed her that she was in A-fib Ex-smoker EXAM: CHEST - 2 VIEW COMPARISON:  02/20/2015 FINDINGS: Lungs are clear. Heart size and mediastinal contours are within normal limits. Aortic Atherosclerosis (ICD10-170.0) No effusion.  No pneumothorax. Visualized bones unremarkable. Peripheral calcifications around breast implants. IMPRESSION: No acute cardiopulmonary disease. Electronically Signed   By: Lucrezia Europe M.D.   On: 11/20/2017 10:50    Procedures Procedures (including critical care time)  Medications Ordered in ED Medications - No data to display   Initial Impression / Assessment and Plan / ED Course  I have reviewed the triage vital signs and the nursing notes.  Pertinent labs & imaging results that were available during  my care of the patient were reviewed by me and considered in my medical decision making (see chart for details).  Clinical Course as of Nov 21 1346  Fri Nov 20, 2017  1332 Discussed with Dr. Debara Pickett cardiology who reviewed her EKG.  He agrees that this looks like sinus with PACs and felt if she otherwise could be discharged she would need to be set up for an outpatient Holter.  He would arrange for his office to contact the patient to get this done.  [MB]    Clinical Course User Index [MB] Hayden Rasmussen, MD      Final Clinical Impressions(s) / ED Diagnoses   Final diagnoses:  Heart palpitations    ED Discharge Orders    None       Hayden Rasmussen, MD 11/21/17 1049

## 2017-11-20 NOTE — ED Notes (Signed)
Patient verbalizes understanding of discharge instructions. Opportunity for questioning and answers were provided. Armband removed by staff, pt discharged from ED ambulatory.   

## 2017-11-21 ENCOUNTER — Encounter (HOSPITAL_COMMUNITY): Payer: Self-pay | Admitting: Emergency Medicine

## 2017-11-23 ENCOUNTER — Encounter: Payer: Self-pay | Admitting: Cardiology

## 2017-11-23 ENCOUNTER — Ambulatory Visit (INDEPENDENT_AMBULATORY_CARE_PROVIDER_SITE_OTHER): Payer: Medicare Other | Admitting: Cardiology

## 2017-11-23 VITALS — BP 120/76 | HR 80 | Ht 67.0 in | Wt 166.8 lb

## 2017-11-23 DIAGNOSIS — R002 Palpitations: Secondary | ICD-10-CM

## 2017-11-23 DIAGNOSIS — R079 Chest pain, unspecified: Secondary | ICD-10-CM | POA: Diagnosis not present

## 2017-11-23 DIAGNOSIS — I491 Atrial premature depolarization: Secondary | ICD-10-CM | POA: Diagnosis not present

## 2017-11-23 HISTORY — DX: Palpitations: R00.2

## 2017-11-23 HISTORY — DX: Atrial premature depolarization: I49.1

## 2017-11-23 NOTE — Progress Notes (Signed)
Cardiology Office Note:    Date:  11/23/2017   ID:  Kristen Holden, DOB 1946/02/11, MRN [id number]  PCP:  Lowella Dandy, NP  Cardiologist:  Jenean Lindau, MD   Referring MD: Lowella Dandy, NP    ASSESSMENT:    1. PAC (premature atrial contraction)   2. Chest pain, unspecified type   3. Palpitations    PLAN:    In order of problems listed above:  1. I discussed my findings with the patient at extensive length and reassured her.  Her TSH is normal.  I would do an echocardiogram to assess her left ventricular systolic function and left atrial size. 2. The patient will undergo stress echo to assess her rhythm on exercise and also to evaluate her chest pain though there atypical symptoms and she does not have a lot of risk factors. 3. We will do a 48-hour Holter monitor to assess her symptoms of palpitations and skipped beats. 4. Patient will be seen in follow-up appointment in 3 months or earlier if the patient has any concerns    Medication Adjustments/Labs and Tests Ordered: Current medicines are reviewed at length with the patient today.  Concerns regarding medicines are outlined above.  Orders Placed This Encounter  Procedures  . HOLTER MONITOR - 48 HOUR  . ECHOCARDIOGRAM COMPLETE  . ECHOCARDIOGRAM STRESS TEST   No orders of the defined types were placed in this encounter.    History of Present Illness:    Kristen Holden is a 72 y.o. female who is being seen today for the evaluation of skipped beat feeling and palpitations at the request of Moon, Amy A, NP.  Patient is a pleasant 71 year old female.  She has no significant past medical history.  She is otherwise a healthy female.  She has mildly elevated blood pressure.  The patient mentions to me that she was told to have an irregular heartbeat but never diagnosed to be in atrial fibrillation.  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  No chest pain  orthopnea or PND.  She  mentions to me that she has chest pain at times which is not related to exertion.  Her mother and sisters had coronary artery disease so she worries about this or not.  At the time of my evaluation, the patient is alert awake oriented and in no distress.  Past Medical History:  Diagnosis Date  . Hypertension   . Splenic artery aneurysm ([place])   . Thyroid disease     Past Surgical History:  Procedure Laterality Date  . ABDOMINAL HYSTERECTOMY    . breast augumentation Bilateral   . TONSILLECTOMY      Current Medications: Current Meds  Medication Sig  . aspirin 81 MG tablet Take 81 mg by mouth daily.  . hydrochlorothiazide (HYDRODIURIL) 12.5 MG tablet Take 12.5 mg by mouth daily.  Marland Kitchen levothyroxine (SYNTHROID, LEVOTHROID) 88 MCG tablet Take 88 mcg by mouth daily before breakfast.  . mometasone (ELOCON) 0.1 % cream Apply twice a day in left ear canal for 10 days then stop  . montelukast (SINGULAIR) 10 MG tablet TK 1 T PO D FOR CONGESTION  . omeprazole (PRILOSEC) 20 MG capsule TK ONE C PO  QD  . sertraline (ZOLOFT) 100 MG tablet Take 100 mg by mouth daily.  . Vitamin D, Ergocalciferol, (DRISDOL) 50000 UNITS CAPS capsule Take 50,000 Units by mouth every 7 (seven) days.  . [  DISCONTINUED] fluticasone (FLONASE) 50 MCG/ACT nasal spray Place 2 sprays into both nostrils daily.     Allergies:   Patient has no known allergies.   Social History   Socioeconomic History  . Marital status: Married    Spouse name: None  . Number of children: None  . Years of education: None  . Highest education level: None  Social Needs  . Financial resource strain: None  . Food insecurity - worry: None  . Food insecurity - inability: None  . Transportation needs - medical: None  . Transportation needs - non-medical: None  Occupational History  . None  Tobacco Use  . Smoking status: Former Smoker    Last attempt to quit: 09/22/1996    Years since quitting: 21.1  . Smokeless  tobacco: Never Used  Substance and Sexual Activity  . Alcohol use: Yes    Comment: 1 drink/ week  . Drug use: No  . Sexual activity: None  Other Topics Concern  . None  Social History Narrative  . None     Family History: The patient's family history includes Cancer in her father; Diabetes in her mother; Hypertension in her father and mother; Varicose Veins in her mother.  ROS:   Please see the history of present illness.    All other systems reviewed and are negative.  EKGs/Labs/Other Studies Reviewed:    The following studies were reviewed today: I reviewed emergency room records and EKGs and this reveals her to be in sinus rhythm.  Previous EKGs revealed her to be having PACs frequently.  Her TSH was within normal limits.   Recent Labs: 11/20/2017: BUN 17; Creatinine, Ser 1.01; Hemoglobin 14.7; Platelets 197; Potassium 4.4; Sodium 139; TSH 3.415  Recent Lipid Panel No results found for: CHOL, TRIG, HDL, CHOLHDL, VLDL, LDLCALC, LDLDIRECT  Physical Exam:    VS:  BP 120/76 (BP Location: Left Arm, Patient Position: Sitting, Cuff Size: Normal)   Pulse 80   Ht 5\' 7"  (1.702 m)   Wt 166 lb 12.8 oz (75.7 kg)   SpO2 98%   BMI 26.12 kg/m     Wt Readings from Last 3 Encounters:  11/23/17 166 lb 12.8 oz (75.7 kg)  11/20/17 175 lb (79.4 kg)  10/04/15 171 lb 4.8 oz (77.7 kg)     GEN: Patient is in no acute distress HEENT: Normal NECK: No JVD; No carotid bruits LYMPHATICS: No lymphadenopathy CARDIAC: S1 S2 regular, 2/6 systolic murmur at the apex. RESPIRATORY:  Clear to auscultation without rales, wheezing or rhonchi  ABDOMEN: Soft, non-tender, non-distended MUSCULOSKELETAL:  No edema; No deformity  SKIN: Warm and dry NEUROLOGIC:  Alert and oriented x 3 PSYCHIATRIC:  Normal affect    Signed, Jenean Lindau, MD  11/23/2017 3:10 PM    [facility]

## 2017-11-23 NOTE — Patient Instructions (Signed)
Medication Instructions:  Your physician recommends that you continue on your current medications as directed. Please refer to the Current Medication list given to you today.  Labwork: None  Testing/Procedures: Your physician has requested that you have an echocardiogram. Echocardiography is a painless test that uses sound waves to create images of your heart. It provides your doctor with information about the size and shape of your heart and how well your heart's chambers and valves are working. This procedure takes approximately one hour. There are no restrictions for this procedure.  Your physician has requested that you have a stress echocardiogram. For further information please visit [email or web address]. Please follow instruction sheet as given.  Your physician has recommended that you wear a holter monitor. Holter monitors are medical devices that record the heart's electrical activity. Doctors most often use these monitors to diagnose arrhythmias. Arrhythmias are problems with the speed or rhythm of the heartbeat. The monitor is a small, portable device. You can wear one while you do your normal daily activities. This is usually used to diagnose what is causing palpitations/syncope (passing out).  Follow-Up: Your physician recommends that you schedule a follow-up appointment in: 6 months  Any Other Special Instructions Will Be Listed Below (If Applicable).     If you need a refill on your cardiac medications before your next appointment, please call your pharmacy.   [place], RN, BSN

## 2017-12-10 DIAGNOSIS — Z0000 Encounter for general adult medical examination without abnormal findings: Secondary | ICD-10-CM | POA: Diagnosis not present

## 2017-12-10 DIAGNOSIS — E785 Hyperlipidemia, unspecified: Secondary | ICD-10-CM | POA: Diagnosis not present

## 2017-12-10 DIAGNOSIS — Z9181 History of falling: Secondary | ICD-10-CM | POA: Diagnosis not present

## 2017-12-10 DIAGNOSIS — N959 Unspecified menopausal and perimenopausal disorder: Secondary | ICD-10-CM | POA: Diagnosis not present

## 2017-12-10 DIAGNOSIS — Z1331 Encounter for screening for depression: Secondary | ICD-10-CM | POA: Diagnosis not present

## 2017-12-10 DIAGNOSIS — Z1231 Encounter for screening mammogram for malignant neoplasm of breast: Secondary | ICD-10-CM | POA: Diagnosis not present

## 2017-12-10 DIAGNOSIS — Z136 Encounter for screening for cardiovascular disorders: Secondary | ICD-10-CM | POA: Diagnosis not present

## 2017-12-16 ENCOUNTER — Ambulatory Visit (HOSPITAL_BASED_OUTPATIENT_CLINIC_OR_DEPARTMENT_OTHER)
Admission: RE | Admit: 2017-12-16 | Discharge: 2017-12-16 | Disposition: A | Discharge status: Home / Self Care | Payer: Medicare Other | Source: Ambulatory Visit | Attending: Internal Medicine | Admitting: Internal Medicine

## 2017-12-16 DIAGNOSIS — I491 Atrial premature depolarization: Secondary | ICD-10-CM | POA: Diagnosis not present

## 2017-12-16 DIAGNOSIS — R079 Chest pain, unspecified: Secondary | ICD-10-CM | POA: Diagnosis not present

## 2017-12-16 DIAGNOSIS — R002 Palpitations: Secondary | ICD-10-CM | POA: Diagnosis not present

## 2017-12-16 DIAGNOSIS — I071 Rheumatic tricuspid insufficiency: Secondary | ICD-10-CM | POA: Insufficient documentation

## 2017-12-16 NOTE — Progress Notes (Signed)
Echocardiogram 2D Echocardiogram has been performed.  [name] 12/16/2017, 9:06 AM

## 2017-12-18 ENCOUNTER — Ambulatory Visit: Payer: Medicare Other

## 2017-12-18 ENCOUNTER — Ambulatory Visit (HOSPITAL_BASED_OUTPATIENT_CLINIC_OR_DEPARTMENT_OTHER)
Admission: RE | Admit: 2017-12-18 | Discharge: 2017-12-18 | Disposition: A | Discharge status: Home / Self Care | Payer: Medicare Other | Source: Ambulatory Visit | Attending: Internal Medicine | Admitting: Internal Medicine

## 2017-12-18 DIAGNOSIS — R079 Chest pain, unspecified: Secondary | ICD-10-CM

## 2017-12-18 DIAGNOSIS — I491 Atrial premature depolarization: Secondary | ICD-10-CM | POA: Insufficient documentation

## 2017-12-18 DIAGNOSIS — R002 Palpitations: Secondary | ICD-10-CM | POA: Diagnosis not present

## 2017-12-18 NOTE — Progress Notes (Signed)
  Echocardiogram Echocardiogram Stress Test has been performed.  [name] 12/18/2017, 11:36 AM

## 2017-12-23 ENCOUNTER — Other Ambulatory Visit: Payer: Self-pay

## 2017-12-23 MED ORDER — METOPROLOL SUCCINATE 25 MG PO CS24: 25.0000 mg | EXTENDED_RELEASE_CAPSULE | Freq: Every day | ORAL | 0 refills | Status: DC

## 2017-12-24 ENCOUNTER — Telehealth: Payer: Self-pay | Admitting: Cardiology

## 2017-12-24 ENCOUNTER — Other Ambulatory Visit: Payer: Self-pay

## 2017-12-24 MED ORDER — METOPROLOL SUCCINATE ER 25 MG PO TB24: 25.0000 mg | ORAL_TABLET | Freq: Every day | ORAL | 0 refills | Status: DC

## 2017-12-24 NOTE — Telephone Encounter (Signed)
Please call pharmacy regarding clarification on scripts

## 2017-12-24 NOTE — Telephone Encounter (Signed)
Beta blocker has been changed.

## 2018-01-19 ENCOUNTER — Other Ambulatory Visit: Payer: Self-pay

## 2018-01-19 MED ORDER — METOPROLOL SUCCINATE ER 25 MG PO TB24: 25.0000 mg | ORAL_TABLET | Freq: Every day | ORAL | 2 refills | Status: DC

## 2018-03-20 DIAGNOSIS — S82302A Unspecified fracture of lower end of left tibia, initial encounter for closed fracture: Secondary | ICD-10-CM | POA: Diagnosis not present

## 2018-03-20 DIAGNOSIS — G8911 Acute pain due to trauma: Secondary | ICD-10-CM | POA: Diagnosis not present

## 2018-03-20 DIAGNOSIS — S8292XB Unspecified fracture of left lower leg, initial encounter for open fracture type I or II: Secondary | ICD-10-CM | POA: Diagnosis not present

## 2018-03-20 DIAGNOSIS — I10 Essential (primary) hypertension: Secondary | ICD-10-CM | POA: Diagnosis not present

## 2018-03-20 DIAGNOSIS — E079 Disorder of thyroid, unspecified: Secondary | ICD-10-CM | POA: Diagnosis not present

## 2018-03-20 DIAGNOSIS — M25561 Pain in right knee: Secondary | ICD-10-CM | POA: Diagnosis not present

## 2018-03-20 DIAGNOSIS — M25552 Pain in left hip: Secondary | ICD-10-CM | POA: Diagnosis not present

## 2018-03-20 DIAGNOSIS — S92415A Nondisplaced fracture of proximal phalanx of left great toe, initial encounter for closed fracture: Secondary | ICD-10-CM | POA: Diagnosis present

## 2018-03-20 DIAGNOSIS — S79911A Unspecified injury of right hip, initial encounter: Secondary | ICD-10-CM | POA: Diagnosis not present

## 2018-03-20 DIAGNOSIS — S79912A Unspecified injury of left hip, initial encounter: Secondary | ICD-10-CM | POA: Diagnosis not present

## 2018-03-20 DIAGNOSIS — S82191A Other fracture of upper end of right tibia, initial encounter for closed fracture: Secondary | ICD-10-CM | POA: Diagnosis not present

## 2018-03-20 DIAGNOSIS — S299XXA Unspecified injury of thorax, initial encounter: Secondary | ICD-10-CM | POA: Diagnosis not present

## 2018-03-20 DIAGNOSIS — M25551 Pain in right hip: Secondary | ICD-10-CM | POA: Diagnosis not present

## 2018-03-20 DIAGNOSIS — S82202A Unspecified fracture of shaft of left tibia, initial encounter for closed fracture: Secondary | ICD-10-CM | POA: Diagnosis present

## 2018-03-20 DIAGNOSIS — S82402A Unspecified fracture of shaft of left fibula, initial encounter for closed fracture: Secondary | ICD-10-CM | POA: Diagnosis not present

## 2018-03-20 DIAGNOSIS — F329 Major depressive disorder, single episode, unspecified: Secondary | ICD-10-CM | POA: Diagnosis not present

## 2018-03-20 DIAGNOSIS — S82831A Other fracture of upper and lower end of right fibula, initial encounter for closed fracture: Secondary | ICD-10-CM | POA: Diagnosis not present

## 2018-03-20 DIAGNOSIS — S92416A Nondisplaced fracture of proximal phalanx of unspecified great toe, initial encounter for closed fracture: Secondary | ICD-10-CM | POA: Diagnosis not present

## 2018-03-20 DIAGNOSIS — Z043 Encounter for examination and observation following other accident: Secondary | ICD-10-CM | POA: Diagnosis not present

## 2018-03-20 DIAGNOSIS — R079 Chest pain, unspecified: Secondary | ICD-10-CM | POA: Diagnosis not present

## 2018-03-20 DIAGNOSIS — S82209A Unspecified fracture of shaft of unspecified tibia, initial encounter for closed fracture: Secondary | ICD-10-CM | POA: Diagnosis not present

## 2018-03-20 DIAGNOSIS — S8991XA Unspecified injury of right lower leg, initial encounter: Secondary | ICD-10-CM | POA: Diagnosis not present

## 2018-03-20 DIAGNOSIS — E039 Hypothyroidism, unspecified: Secondary | ICD-10-CM | POA: Diagnosis not present

## 2018-03-28 DIAGNOSIS — S92415D Nondisplaced fracture of proximal phalanx of left great toe, subsequent encounter for fracture with routine healing: Secondary | ICD-10-CM | POA: Diagnosis not present

## 2018-03-28 DIAGNOSIS — E039 Hypothyroidism, unspecified: Secondary | ICD-10-CM | POA: Diagnosis not present

## 2018-03-28 DIAGNOSIS — Z9181 History of falling: Secondary | ICD-10-CM | POA: Diagnosis not present

## 2018-03-28 DIAGNOSIS — S82832D Other fracture of upper and lower end of left fibula, subsequent encounter for closed fracture with routine healing: Secondary | ICD-10-CM | POA: Diagnosis not present

## 2018-03-28 DIAGNOSIS — I10 Essential (primary) hypertension: Secondary | ICD-10-CM | POA: Diagnosis not present

## 2018-03-28 DIAGNOSIS — Z7982 Long term (current) use of aspirin: Secondary | ICD-10-CM | POA: Diagnosis not present

## 2018-03-28 DIAGNOSIS — F329 Major depressive disorder, single episode, unspecified: Secondary | ICD-10-CM | POA: Diagnosis not present

## 2018-03-28 DIAGNOSIS — S82302D Unspecified fracture of lower end of left tibia, subsequent encounter for closed fracture with routine healing: Secondary | ICD-10-CM | POA: Diagnosis not present

## 2018-03-30 DIAGNOSIS — F329 Major depressive disorder, single episode, unspecified: Secondary | ICD-10-CM | POA: Diagnosis not present

## 2018-03-30 DIAGNOSIS — S82832D Other fracture of upper and lower end of left fibula, subsequent encounter for closed fracture with routine healing: Secondary | ICD-10-CM | POA: Diagnosis not present

## 2018-03-30 DIAGNOSIS — E039 Hypothyroidism, unspecified: Secondary | ICD-10-CM | POA: Diagnosis not present

## 2018-03-30 DIAGNOSIS — S82302D Unspecified fracture of lower end of left tibia, subsequent encounter for closed fracture with routine healing: Secondary | ICD-10-CM | POA: Diagnosis not present

## 2018-03-30 DIAGNOSIS — I10 Essential (primary) hypertension: Secondary | ICD-10-CM | POA: Diagnosis not present

## 2018-03-30 DIAGNOSIS — S92415D Nondisplaced fracture of proximal phalanx of left great toe, subsequent encounter for fracture with routine healing: Secondary | ICD-10-CM | POA: Diagnosis not present

## 2018-04-01 DIAGNOSIS — S82302D Unspecified fracture of lower end of left tibia, subsequent encounter for closed fracture with routine healing: Secondary | ICD-10-CM | POA: Diagnosis not present

## 2018-04-01 DIAGNOSIS — F329 Major depressive disorder, single episode, unspecified: Secondary | ICD-10-CM | POA: Diagnosis not present

## 2018-04-01 DIAGNOSIS — I10 Essential (primary) hypertension: Secondary | ICD-10-CM | POA: Diagnosis not present

## 2018-04-01 DIAGNOSIS — E039 Hypothyroidism, unspecified: Secondary | ICD-10-CM | POA: Diagnosis not present

## 2018-04-01 DIAGNOSIS — S82832D Other fracture of upper and lower end of left fibula, subsequent encounter for closed fracture with routine healing: Secondary | ICD-10-CM | POA: Diagnosis not present

## 2018-04-01 DIAGNOSIS — S92415D Nondisplaced fracture of proximal phalanx of left great toe, subsequent encounter for fracture with routine healing: Secondary | ICD-10-CM | POA: Diagnosis not present

## 2018-04-05 DIAGNOSIS — Z743 Need for continuous supervision: Secondary | ICD-10-CM | POA: Diagnosis not present

## 2018-04-05 DIAGNOSIS — Z96652 Presence of left artificial knee joint: Secondary | ICD-10-CM | POA: Diagnosis not present

## 2018-04-05 DIAGNOSIS — I82402 Acute embolism and thrombosis of unspecified deep veins of left lower extremity: Secondary | ICD-10-CM | POA: Diagnosis not present

## 2018-04-05 DIAGNOSIS — J90 Pleural effusion, not elsewhere classified: Secondary | ICD-10-CM | POA: Diagnosis not present

## 2018-04-05 DIAGNOSIS — R7989 Other specified abnormal findings of blood chemistry: Secondary | ICD-10-CM | POA: Diagnosis not present

## 2018-04-05 DIAGNOSIS — I82412 Acute embolism and thrombosis of left femoral vein: Secondary | ICD-10-CM | POA: Diagnosis not present

## 2018-04-05 DIAGNOSIS — I824Z2 Acute embolism and thrombosis of unspecified deep veins of left distal lower extremity: Secondary | ICD-10-CM | POA: Diagnosis not present

## 2018-04-05 DIAGNOSIS — T8481XA Embolism due to internal orthopedic prosthetic devices, implants and grafts, initial encounter: Secondary | ICD-10-CM | POA: Diagnosis not present

## 2018-04-05 DIAGNOSIS — J9811 Atelectasis: Secondary | ICD-10-CM | POA: Diagnosis not present

## 2018-04-06 DIAGNOSIS — K219 Gastro-esophageal reflux disease without esophagitis: Secondary | ICD-10-CM | POA: Diagnosis present

## 2018-04-06 DIAGNOSIS — E039 Hypothyroidism, unspecified: Secondary | ICD-10-CM | POA: Diagnosis present

## 2018-04-06 DIAGNOSIS — S82402D Unspecified fracture of shaft of left fibula, subsequent encounter for closed fracture with routine healing: Secondary | ICD-10-CM | POA: Diagnosis not present

## 2018-04-06 DIAGNOSIS — N3000 Acute cystitis without hematuria: Secondary | ICD-10-CM | POA: Diagnosis present

## 2018-04-06 DIAGNOSIS — I82402 Acute embolism and thrombosis of unspecified deep veins of left lower extremity: Secondary | ICD-10-CM | POA: Diagnosis not present

## 2018-04-06 DIAGNOSIS — D649 Anemia, unspecified: Secondary | ICD-10-CM | POA: Diagnosis not present

## 2018-04-06 DIAGNOSIS — I871 Compression of vein: Secondary | ICD-10-CM | POA: Diagnosis present

## 2018-04-06 DIAGNOSIS — D7582 Heparin induced thrombocytopenia (HIT): Secondary | ICD-10-CM | POA: Diagnosis not present

## 2018-04-06 DIAGNOSIS — N3090 Cystitis, unspecified without hematuria: Secondary | ICD-10-CM | POA: Diagnosis not present

## 2018-04-06 DIAGNOSIS — S82202A Unspecified fracture of shaft of left tibia, initial encounter for closed fracture: Secondary | ICD-10-CM | POA: Diagnosis not present

## 2018-04-06 DIAGNOSIS — B961 Klebsiella pneumoniae [K. pneumoniae] as the cause of diseases classified elsewhere: Secondary | ICD-10-CM | POA: Diagnosis present

## 2018-04-06 DIAGNOSIS — D696 Thrombocytopenia, unspecified: Secondary | ICD-10-CM | POA: Diagnosis not present

## 2018-04-06 DIAGNOSIS — J90 Pleural effusion, not elsewhere classified: Secondary | ICD-10-CM | POA: Diagnosis not present

## 2018-04-06 DIAGNOSIS — I82412 Acute embolism and thrombosis of left femoral vein: Secondary | ICD-10-CM | POA: Diagnosis present

## 2018-04-06 DIAGNOSIS — M7989 Other specified soft tissue disorders: Secondary | ICD-10-CM | POA: Diagnosis not present

## 2018-04-06 DIAGNOSIS — M79606 Pain in leg, unspecified: Secondary | ICD-10-CM | POA: Diagnosis not present

## 2018-04-06 DIAGNOSIS — I10 Essential (primary) hypertension: Secondary | ICD-10-CM | POA: Diagnosis present

## 2018-04-06 DIAGNOSIS — J9811 Atelectasis: Secondary | ICD-10-CM | POA: Diagnosis not present

## 2018-04-06 DIAGNOSIS — I82422 Acute embolism and thrombosis of left iliac vein: Secondary | ICD-10-CM | POA: Diagnosis present

## 2018-04-06 DIAGNOSIS — R05 Cough: Secondary | ICD-10-CM | POA: Diagnosis not present

## 2018-04-06 DIAGNOSIS — E559 Vitamin D deficiency, unspecified: Secondary | ICD-10-CM | POA: Diagnosis present

## 2018-04-06 DIAGNOSIS — R509 Fever, unspecified: Secondary | ICD-10-CM | POA: Diagnosis not present

## 2018-04-06 DIAGNOSIS — F329 Major depressive disorder, single episode, unspecified: Secondary | ICD-10-CM | POA: Diagnosis present

## 2018-04-06 DIAGNOSIS — S82202D Unspecified fracture of shaft of left tibia, subsequent encounter for closed fracture with routine healing: Secondary | ICD-10-CM | POA: Diagnosis not present

## 2018-04-06 DIAGNOSIS — I82432 Acute embolism and thrombosis of left popliteal vein: Secondary | ICD-10-CM | POA: Diagnosis not present

## 2018-04-15 DIAGNOSIS — S82302D Unspecified fracture of lower end of left tibia, subsequent encounter for closed fracture with routine healing: Secondary | ICD-10-CM | POA: Diagnosis not present

## 2018-04-15 DIAGNOSIS — S82832D Other fracture of upper and lower end of left fibula, subsequent encounter for closed fracture with routine healing: Secondary | ICD-10-CM | POA: Diagnosis not present

## 2018-04-15 DIAGNOSIS — F329 Major depressive disorder, single episode, unspecified: Secondary | ICD-10-CM | POA: Diagnosis not present

## 2018-04-15 DIAGNOSIS — S92415D Nondisplaced fracture of proximal phalanx of left great toe, subsequent encounter for fracture with routine healing: Secondary | ICD-10-CM | POA: Diagnosis not present

## 2018-04-15 DIAGNOSIS — M79662 Pain in left lower leg: Secondary | ICD-10-CM | POA: Diagnosis not present

## 2018-04-15 DIAGNOSIS — M79672 Pain in left foot: Secondary | ICD-10-CM | POA: Diagnosis not present

## 2018-04-15 DIAGNOSIS — I10 Essential (primary) hypertension: Secondary | ICD-10-CM | POA: Diagnosis not present

## 2018-04-15 DIAGNOSIS — E039 Hypothyroidism, unspecified: Secondary | ICD-10-CM | POA: Diagnosis not present

## 2018-04-20 DIAGNOSIS — I871 Compression of vein: Secondary | ICD-10-CM | POA: Diagnosis not present

## 2018-04-20 DIAGNOSIS — F329 Major depressive disorder, single episode, unspecified: Secondary | ICD-10-CM | POA: Diagnosis not present

## 2018-04-20 DIAGNOSIS — I10 Essential (primary) hypertension: Secondary | ICD-10-CM | POA: Diagnosis not present

## 2018-04-20 DIAGNOSIS — S82832D Other fracture of upper and lower end of left fibula, subsequent encounter for closed fracture with routine healing: Secondary | ICD-10-CM | POA: Diagnosis not present

## 2018-04-20 DIAGNOSIS — I82402 Acute embolism and thrombosis of unspecified deep veins of left lower extremity: Secondary | ICD-10-CM | POA: Diagnosis not present

## 2018-04-20 DIAGNOSIS — E039 Hypothyroidism, unspecified: Secondary | ICD-10-CM | POA: Diagnosis not present

## 2018-04-20 DIAGNOSIS — S92415D Nondisplaced fracture of proximal phalanx of left great toe, subsequent encounter for fracture with routine healing: Secondary | ICD-10-CM | POA: Diagnosis not present

## 2018-04-20 DIAGNOSIS — R531 Weakness: Secondary | ICD-10-CM | POA: Diagnosis not present

## 2018-04-20 DIAGNOSIS — S82202S Unspecified fracture of shaft of left tibia, sequela: Secondary | ICD-10-CM | POA: Diagnosis not present

## 2018-04-20 DIAGNOSIS — S82302D Unspecified fracture of lower end of left tibia, subsequent encounter for closed fracture with routine healing: Secondary | ICD-10-CM | POA: Diagnosis not present

## 2018-04-22 DIAGNOSIS — S92415D Nondisplaced fracture of proximal phalanx of left great toe, subsequent encounter for fracture with routine healing: Secondary | ICD-10-CM | POA: Diagnosis not present

## 2018-04-22 DIAGNOSIS — I10 Essential (primary) hypertension: Secondary | ICD-10-CM | POA: Diagnosis not present

## 2018-04-22 DIAGNOSIS — S82832D Other fracture of upper and lower end of left fibula, subsequent encounter for closed fracture with routine healing: Secondary | ICD-10-CM | POA: Diagnosis not present

## 2018-04-22 DIAGNOSIS — F329 Major depressive disorder, single episode, unspecified: Secondary | ICD-10-CM | POA: Diagnosis not present

## 2018-04-22 DIAGNOSIS — S82302D Unspecified fracture of lower end of left tibia, subsequent encounter for closed fracture with routine healing: Secondary | ICD-10-CM | POA: Diagnosis not present

## 2018-04-22 DIAGNOSIS — E039 Hypothyroidism, unspecified: Secondary | ICD-10-CM | POA: Diagnosis not present

## 2018-04-23 DIAGNOSIS — S82302D Unspecified fracture of lower end of left tibia, subsequent encounter for closed fracture with routine healing: Secondary | ICD-10-CM | POA: Diagnosis not present

## 2018-04-23 DIAGNOSIS — I10 Essential (primary) hypertension: Secondary | ICD-10-CM | POA: Diagnosis not present

## 2018-04-23 DIAGNOSIS — S82832D Other fracture of upper and lower end of left fibula, subsequent encounter for closed fracture with routine healing: Secondary | ICD-10-CM | POA: Diagnosis not present

## 2018-04-23 DIAGNOSIS — S92415D Nondisplaced fracture of proximal phalanx of left great toe, subsequent encounter for fracture with routine healing: Secondary | ICD-10-CM | POA: Diagnosis not present

## 2018-04-23 DIAGNOSIS — E039 Hypothyroidism, unspecified: Secondary | ICD-10-CM | POA: Diagnosis not present

## 2018-04-23 DIAGNOSIS — F329 Major depressive disorder, single episode, unspecified: Secondary | ICD-10-CM | POA: Diagnosis not present

## 2018-04-26 DIAGNOSIS — F329 Major depressive disorder, single episode, unspecified: Secondary | ICD-10-CM | POA: Diagnosis not present

## 2018-04-26 DIAGNOSIS — E039 Hypothyroidism, unspecified: Secondary | ICD-10-CM | POA: Diagnosis not present

## 2018-04-26 DIAGNOSIS — S82832D Other fracture of upper and lower end of left fibula, subsequent encounter for closed fracture with routine healing: Secondary | ICD-10-CM | POA: Diagnosis not present

## 2018-04-26 DIAGNOSIS — I10 Essential (primary) hypertension: Secondary | ICD-10-CM | POA: Diagnosis not present

## 2018-04-26 DIAGNOSIS — S92415D Nondisplaced fracture of proximal phalanx of left great toe, subsequent encounter for fracture with routine healing: Secondary | ICD-10-CM | POA: Diagnosis not present

## 2018-04-26 DIAGNOSIS — S82302D Unspecified fracture of lower end of left tibia, subsequent encounter for closed fracture with routine healing: Secondary | ICD-10-CM | POA: Diagnosis not present

## 2018-04-28 DIAGNOSIS — S82302D Unspecified fracture of lower end of left tibia, subsequent encounter for closed fracture with routine healing: Secondary | ICD-10-CM | POA: Diagnosis not present

## 2018-04-28 DIAGNOSIS — S92415D Nondisplaced fracture of proximal phalanx of left great toe, subsequent encounter for fracture with routine healing: Secondary | ICD-10-CM | POA: Diagnosis not present

## 2018-04-28 DIAGNOSIS — E039 Hypothyroidism, unspecified: Secondary | ICD-10-CM | POA: Diagnosis not present

## 2018-04-28 DIAGNOSIS — F329 Major depressive disorder, single episode, unspecified: Secondary | ICD-10-CM | POA: Diagnosis not present

## 2018-04-28 DIAGNOSIS — S82832D Other fracture of upper and lower end of left fibula, subsequent encounter for closed fracture with routine healing: Secondary | ICD-10-CM | POA: Diagnosis not present

## 2018-04-28 DIAGNOSIS — I10 Essential (primary) hypertension: Secondary | ICD-10-CM | POA: Diagnosis not present

## 2018-04-30 DIAGNOSIS — E039 Hypothyroidism, unspecified: Secondary | ICD-10-CM | POA: Diagnosis not present

## 2018-04-30 DIAGNOSIS — S82302D Unspecified fracture of lower end of left tibia, subsequent encounter for closed fracture with routine healing: Secondary | ICD-10-CM | POA: Diagnosis not present

## 2018-04-30 DIAGNOSIS — I10 Essential (primary) hypertension: Secondary | ICD-10-CM | POA: Diagnosis not present

## 2018-04-30 DIAGNOSIS — S82832D Other fracture of upper and lower end of left fibula, subsequent encounter for closed fracture with routine healing: Secondary | ICD-10-CM | POA: Diagnosis not present

## 2018-04-30 DIAGNOSIS — F329 Major depressive disorder, single episode, unspecified: Secondary | ICD-10-CM | POA: Diagnosis not present

## 2018-04-30 DIAGNOSIS — S92415D Nondisplaced fracture of proximal phalanx of left great toe, subsequent encounter for fracture with routine healing: Secondary | ICD-10-CM | POA: Diagnosis not present

## 2018-05-03 DIAGNOSIS — E039 Hypothyroidism, unspecified: Secondary | ICD-10-CM | POA: Diagnosis not present

## 2018-05-03 DIAGNOSIS — S82832D Other fracture of upper and lower end of left fibula, subsequent encounter for closed fracture with routine healing: Secondary | ICD-10-CM | POA: Diagnosis not present

## 2018-05-03 DIAGNOSIS — I10 Essential (primary) hypertension: Secondary | ICD-10-CM | POA: Diagnosis not present

## 2018-05-03 DIAGNOSIS — F329 Major depressive disorder, single episode, unspecified: Secondary | ICD-10-CM | POA: Diagnosis not present

## 2018-05-03 DIAGNOSIS — S82302D Unspecified fracture of lower end of left tibia, subsequent encounter for closed fracture with routine healing: Secondary | ICD-10-CM | POA: Diagnosis not present

## 2018-05-03 DIAGNOSIS — S92415D Nondisplaced fracture of proximal phalanx of left great toe, subsequent encounter for fracture with routine healing: Secondary | ICD-10-CM | POA: Diagnosis not present

## 2018-05-06 DIAGNOSIS — S82302D Unspecified fracture of lower end of left tibia, subsequent encounter for closed fracture with routine healing: Secondary | ICD-10-CM | POA: Diagnosis not present

## 2018-05-06 DIAGNOSIS — E039 Hypothyroidism, unspecified: Secondary | ICD-10-CM | POA: Diagnosis not present

## 2018-05-06 DIAGNOSIS — I10 Essential (primary) hypertension: Secondary | ICD-10-CM | POA: Diagnosis not present

## 2018-05-06 DIAGNOSIS — F329 Major depressive disorder, single episode, unspecified: Secondary | ICD-10-CM | POA: Diagnosis not present

## 2018-05-06 DIAGNOSIS — S82832D Other fracture of upper and lower end of left fibula, subsequent encounter for closed fracture with routine healing: Secondary | ICD-10-CM | POA: Diagnosis not present

## 2018-05-06 DIAGNOSIS — S92415D Nondisplaced fracture of proximal phalanx of left great toe, subsequent encounter for fracture with routine healing: Secondary | ICD-10-CM | POA: Diagnosis not present

## 2018-05-17 DIAGNOSIS — S92415D Nondisplaced fracture of proximal phalanx of left great toe, subsequent encounter for fracture with routine healing: Secondary | ICD-10-CM | POA: Diagnosis not present

## 2018-05-17 DIAGNOSIS — S82202D Unspecified fracture of shaft of left tibia, subsequent encounter for closed fracture with routine healing: Secondary | ICD-10-CM | POA: Diagnosis not present

## 2018-05-17 DIAGNOSIS — M79662 Pain in left lower leg: Secondary | ICD-10-CM | POA: Diagnosis not present

## 2018-05-18 DIAGNOSIS — S92415D Nondisplaced fracture of proximal phalanx of left great toe, subsequent encounter for fracture with routine healing: Secondary | ICD-10-CM | POA: Diagnosis not present

## 2018-05-18 DIAGNOSIS — F329 Major depressive disorder, single episode, unspecified: Secondary | ICD-10-CM | POA: Diagnosis not present

## 2018-05-18 DIAGNOSIS — S82832D Other fracture of upper and lower end of left fibula, subsequent encounter for closed fracture with routine healing: Secondary | ICD-10-CM | POA: Diagnosis not present

## 2018-05-18 DIAGNOSIS — E039 Hypothyroidism, unspecified: Secondary | ICD-10-CM | POA: Diagnosis not present

## 2018-05-18 DIAGNOSIS — S82302D Unspecified fracture of lower end of left tibia, subsequent encounter for closed fracture with routine healing: Secondary | ICD-10-CM | POA: Diagnosis not present

## 2018-05-18 DIAGNOSIS — I10 Essential (primary) hypertension: Secondary | ICD-10-CM | POA: Diagnosis not present

## 2018-05-20 DIAGNOSIS — I10 Essential (primary) hypertension: Secondary | ICD-10-CM | POA: Diagnosis not present

## 2018-05-20 DIAGNOSIS — I82422 Acute embolism and thrombosis of left iliac vein: Secondary | ICD-10-CM | POA: Diagnosis not present

## 2018-05-20 DIAGNOSIS — I871 Compression of vein: Secondary | ICD-10-CM | POA: Diagnosis not present

## 2018-05-21 DIAGNOSIS — S82832D Other fracture of upper and lower end of left fibula, subsequent encounter for closed fracture with routine healing: Secondary | ICD-10-CM | POA: Diagnosis not present

## 2018-05-21 DIAGNOSIS — Z6825 Body mass index (BMI) 25.0-25.9, adult: Secondary | ICD-10-CM | POA: Diagnosis not present

## 2018-05-21 DIAGNOSIS — S92415D Nondisplaced fracture of proximal phalanx of left great toe, subsequent encounter for fracture with routine healing: Secondary | ICD-10-CM | POA: Diagnosis not present

## 2018-05-21 DIAGNOSIS — F329 Major depressive disorder, single episode, unspecified: Secondary | ICD-10-CM | POA: Diagnosis not present

## 2018-05-21 DIAGNOSIS — E039 Hypothyroidism, unspecified: Secondary | ICD-10-CM | POA: Diagnosis not present

## 2018-05-21 DIAGNOSIS — R531 Weakness: Secondary | ICD-10-CM | POA: Diagnosis not present

## 2018-05-21 DIAGNOSIS — J90 Pleural effusion, not elsewhere classified: Secondary | ICD-10-CM | POA: Diagnosis not present

## 2018-05-21 DIAGNOSIS — I10 Essential (primary) hypertension: Secondary | ICD-10-CM | POA: Diagnosis not present

## 2018-05-21 DIAGNOSIS — S82302D Unspecified fracture of lower end of left tibia, subsequent encounter for closed fracture with routine healing: Secondary | ICD-10-CM | POA: Diagnosis not present

## 2018-05-25 DIAGNOSIS — I82402 Acute embolism and thrombosis of unspecified deep veins of left lower extremity: Secondary | ICD-10-CM | POA: Diagnosis not present

## 2018-05-26 DIAGNOSIS — S92415D Nondisplaced fracture of proximal phalanx of left great toe, subsequent encounter for fracture with routine healing: Secondary | ICD-10-CM | POA: Diagnosis not present

## 2018-05-26 DIAGNOSIS — I10 Essential (primary) hypertension: Secondary | ICD-10-CM | POA: Diagnosis not present

## 2018-05-26 DIAGNOSIS — F329 Major depressive disorder, single episode, unspecified: Secondary | ICD-10-CM | POA: Diagnosis not present

## 2018-05-26 DIAGNOSIS — S82302D Unspecified fracture of lower end of left tibia, subsequent encounter for closed fracture with routine healing: Secondary | ICD-10-CM | POA: Diagnosis not present

## 2018-05-26 DIAGNOSIS — E039 Hypothyroidism, unspecified: Secondary | ICD-10-CM | POA: Diagnosis not present

## 2018-05-26 DIAGNOSIS — S82832D Other fracture of upper and lower end of left fibula, subsequent encounter for closed fracture with routine healing: Secondary | ICD-10-CM | POA: Diagnosis not present

## 2018-05-27 DIAGNOSIS — S82832D Other fracture of upper and lower end of left fibula, subsequent encounter for closed fracture with routine healing: Secondary | ICD-10-CM | POA: Diagnosis not present

## 2018-05-27 DIAGNOSIS — I10 Essential (primary) hypertension: Secondary | ICD-10-CM | POA: Diagnosis not present

## 2018-05-27 DIAGNOSIS — S92415D Nondisplaced fracture of proximal phalanx of left great toe, subsequent encounter for fracture with routine healing: Secondary | ICD-10-CM | POA: Diagnosis not present

## 2018-05-27 DIAGNOSIS — Z7901 Long term (current) use of anticoagulants: Secondary | ICD-10-CM | POA: Diagnosis not present

## 2018-05-27 DIAGNOSIS — F329 Major depressive disorder, single episode, unspecified: Secondary | ICD-10-CM | POA: Diagnosis not present

## 2018-05-27 DIAGNOSIS — E039 Hypothyroidism, unspecified: Secondary | ICD-10-CM | POA: Diagnosis not present

## 2018-05-27 DIAGNOSIS — S82302D Unspecified fracture of lower end of left tibia, subsequent encounter for closed fracture with routine healing: Secondary | ICD-10-CM | POA: Diagnosis not present

## 2018-05-27 DIAGNOSIS — Z9181 History of falling: Secondary | ICD-10-CM | POA: Diagnosis not present

## 2018-06-04 DIAGNOSIS — I10 Essential (primary) hypertension: Secondary | ICD-10-CM | POA: Diagnosis not present

## 2018-06-04 DIAGNOSIS — E039 Hypothyroidism, unspecified: Secondary | ICD-10-CM | POA: Diagnosis not present

## 2018-06-04 DIAGNOSIS — S82302D Unspecified fracture of lower end of left tibia, subsequent encounter for closed fracture with routine healing: Secondary | ICD-10-CM | POA: Diagnosis not present

## 2018-06-04 DIAGNOSIS — S92415D Nondisplaced fracture of proximal phalanx of left great toe, subsequent encounter for fracture with routine healing: Secondary | ICD-10-CM | POA: Diagnosis not present

## 2018-06-04 DIAGNOSIS — F329 Major depressive disorder, single episode, unspecified: Secondary | ICD-10-CM | POA: Diagnosis not present

## 2018-06-04 DIAGNOSIS — S82832D Other fracture of upper and lower end of left fibula, subsequent encounter for closed fracture with routine healing: Secondary | ICD-10-CM | POA: Diagnosis not present

## 2018-06-09 DIAGNOSIS — S82832D Other fracture of upper and lower end of left fibula, subsequent encounter for closed fracture with routine healing: Secondary | ICD-10-CM | POA: Diagnosis not present

## 2018-06-09 DIAGNOSIS — S92415D Nondisplaced fracture of proximal phalanx of left great toe, subsequent encounter for fracture with routine healing: Secondary | ICD-10-CM | POA: Diagnosis not present

## 2018-06-09 DIAGNOSIS — E039 Hypothyroidism, unspecified: Secondary | ICD-10-CM | POA: Diagnosis not present

## 2018-06-09 DIAGNOSIS — I10 Essential (primary) hypertension: Secondary | ICD-10-CM | POA: Diagnosis not present

## 2018-06-09 DIAGNOSIS — F329 Major depressive disorder, single episode, unspecified: Secondary | ICD-10-CM | POA: Diagnosis not present

## 2018-06-09 DIAGNOSIS — S82302D Unspecified fracture of lower end of left tibia, subsequent encounter for closed fracture with routine healing: Secondary | ICD-10-CM | POA: Diagnosis not present

## 2018-06-14 DIAGNOSIS — M79662 Pain in left lower leg: Secondary | ICD-10-CM | POA: Diagnosis not present

## 2018-06-14 DIAGNOSIS — S82242D Displaced spiral fracture of shaft of left tibia, subsequent encounter for closed fracture with routine healing: Secondary | ICD-10-CM | POA: Diagnosis not present

## 2018-06-15 ENCOUNTER — Ambulatory Visit (INDEPENDENT_AMBULATORY_CARE_PROVIDER_SITE_OTHER): Payer: Medicare Other | Admitting: Cardiology

## 2018-06-15 ENCOUNTER — Encounter: Payer: Self-pay | Admitting: Cardiology

## 2018-06-15 VITALS — BP 118/68 | HR 90 | Ht 67.0 in | Wt 168.6 lb

## 2018-06-15 DIAGNOSIS — R002 Palpitations: Secondary | ICD-10-CM

## 2018-06-15 DIAGNOSIS — I491 Atrial premature depolarization: Secondary | ICD-10-CM | POA: Diagnosis not present

## 2018-06-15 MED ORDER — METOPROLOL SUCCINATE ER 25 MG PO TB24: 25.0000 mg | ORAL_TABLET | Freq: Every day | ORAL | 2 refills | Status: DC

## 2018-06-15 NOTE — Progress Notes (Signed)
Cardiology Office Note:    Date:  06/15/2018   ID:  [name], DOB 12-Jun-1946, MRN [id number]  PCP:  Lowella Dandy, NP  Cardiologist:  Jenean Lindau, MD   Referring MD: Lowella Dandy, NP    ASSESSMENT:    1. PAC (premature atrial contraction)   2. Palpitations    PLAN:    In order of problems listed above:  1. Patient's condition is currently stable from a cardiovascular standpoint.  Her palpitations and PACs have been better with metoprolol. 2. She will continue anticoagulation and benefits and risks explained to her.  This is managed by her vascular surgeon. 3. Patient will be seen in follow-up appointment in 6 months or earlier if the patient has any concerns    Medication Adjustments/Labs and Tests Ordered: Current medicines are reviewed at length with the patient today.  Concerns regarding medicines are outlined above.  No orders of the defined types were placed in this encounter.  No orders of the defined types were placed in this encounter.    No chief complaint on file.    History of Present Illness:    Kristen Holden is a 72 y.o. female.  Patient has known coronary artery disease.  Patient was evaluated for palpitations.  Subsequently these have been better on metoprolol.  She finally on and had a significant injury to her left lower extremity.  Subsequently she tells me that she developed a significant deep venous thrombosis and pneumonia and many such problems and was admitted to the hospital and subsequently discharged.  She had thrombectomy surgery.  Now she has done well.  She is getting back on her feet.  She is off the boot.  She is taking her anticoagulation meticulously.  At the time of my evaluation, the patient is alert awake oriented and in no distress.  Past Medical History:  Diagnosis Date  . Hypertension   . Splenic artery aneurysm ([place])   . Thyroid disease     Past Surgical History:  Procedure Laterality Date  . ABDOMINAL  HYSTERECTOMY    . breast augumentation Bilateral   . TONSILLECTOMY      Current Medications: Current Meds  Medication Sig  . ELIQUIS 5 MG TABS tablet Take 5 mg by mouth 2 (two) times daily.  Marland Kitchen levothyroxine (SYNTHROID, LEVOTHROID) 88 MCG tablet Take 88 mcg by mouth daily before breakfast.  . metoprolol succinate (TOPROL-XL) 25 MG 24 hr tablet Take 1 tablet (25 mg total) by mouth daily.  . mometasone (ELOCON) 0.1 % cream Apply twice a day in left ear canal for 10 days then stop  . montelukast (SINGULAIR) 10 MG tablet TK 1 T PO D FOR CONGESTION  . omeprazole (PRILOSEC) 20 MG capsule TK ONE C PO  QD  . sertraline (ZOLOFT) 100 MG tablet Take 100 mg by mouth daily.  . Vitamin D, Ergocalciferol, (DRISDOL) 50000 UNITS CAPS capsule Take 50,000 Units by mouth every 7 (seven) days.     Allergies:   Patient has no known allergies.   Social History   Socioeconomic History  . Marital status: Married    Spouse name: Not on file  . Number of children: Not on file  . Years of education: Not on file  . Highest education level: Not on file  Occupational History  . Not on file  Social Needs  . Financial resource strain: Not on file  . Food insecurity:    Worry: Not on file    Inability:  Not on file  . Transportation needs:    Medical: Not on file    Non-medical: Not on file  Tobacco Use  . Smoking status: Former Smoker    Last attempt to quit: 09/22/1996    Years since quitting: 21.7  . Smokeless tobacco: Never Used  Substance and Sexual Activity  . Alcohol use: Yes    Comment: 1 drink/ week  . Drug use: No  . Sexual activity: Not on file  Lifestyle  . Physical activity:    Days per week: Not on file    Minutes per session: Not on file  . Stress: Not on file  Relationships  . Social connections:    Talks on phone: Not on file    Gets together: Not on file    Attends religious service: Not on file    Active member of club or organization: Not on file    Attends meetings of clubs  or organizations: Not on file    Relationship status: Not on file  Other Topics Concern  . Not on file  Social History Narrative  . Not on file     Family History: The patient's family history includes Cancer in her father; Diabetes in her mother; Hypertension in her father and mother; Varicose Veins in her mother.  ROS:   Please see the history of present illness.    All other systems reviewed and are negative.  EKGs/Labs/Other Studies Reviewed:    The following studies were reviewed today: I discussed my findings with the patient at extensive length.   Recent Labs: 11/20/2017: BUN 17; Creatinine, Ser 1.01; Hemoglobin 14.7; Platelets 197; Potassium 4.4; Sodium 139; TSH 3.415  Recent Lipid Panel No results found for: CHOL, TRIG, HDL, CHOLHDL, VLDL, LDLCALC, LDLDIRECT  Physical Exam:    VS:  BP 118/68 (BP Location: Right Arm, Patient Position: Sitting, Cuff Size: Normal)   Pulse 90   Ht 5\' 7"  (1.702 m)   Wt 168 lb 9.6 oz (76.5 kg)   SpO2 98%   BMI 26.41 kg/m     Wt Readings from Last 3 Encounters:  06/15/18 168 lb 9.6 oz (76.5 kg)  11/23/17 166 lb 12.8 oz (75.7 kg)  11/20/17 175 lb (79.4 kg)     GEN: Patient is in no acute distress HEENT: Normal NECK: No JVD; No carotid bruits LYMPHATICS: No lymphadenopathy CARDIAC: Hear sounds regular, 2/6 systolic murmur at the apex. RESPIRATORY:  Clear to auscultation without rales, wheezing or rhonchi  ABDOMEN: Soft, non-tender, non-distended MUSCULOSKELETAL:  No edema; No deformity  SKIN: Warm and dry NEUROLOGIC:  Alert and oriented x 3 PSYCHIATRIC:  Normal affect   Signed, Jenean Lindau, MD  06/15/2018 4:26 PM    [facility]

## 2018-06-15 NOTE — Patient Instructions (Signed)

## 2018-06-16 DIAGNOSIS — Z86718 Personal history of other venous thrombosis and embolism: Secondary | ICD-10-CM | POA: Diagnosis not present

## 2018-06-16 DIAGNOSIS — E039 Hypothyroidism, unspecified: Secondary | ICD-10-CM | POA: Diagnosis not present

## 2018-06-16 DIAGNOSIS — S82302D Unspecified fracture of lower end of left tibia, subsequent encounter for closed fracture with routine healing: Secondary | ICD-10-CM | POA: Diagnosis not present

## 2018-06-16 DIAGNOSIS — I739 Peripheral vascular disease, unspecified: Secondary | ICD-10-CM | POA: Diagnosis not present

## 2018-06-16 DIAGNOSIS — F329 Major depressive disorder, single episode, unspecified: Secondary | ICD-10-CM | POA: Diagnosis not present

## 2018-06-16 DIAGNOSIS — S92415D Nondisplaced fracture of proximal phalanx of left great toe, subsequent encounter for fracture with routine healing: Secondary | ICD-10-CM | POA: Diagnosis not present

## 2018-06-16 DIAGNOSIS — Z7901 Long term (current) use of anticoagulants: Secondary | ICD-10-CM | POA: Diagnosis not present

## 2018-06-16 DIAGNOSIS — I10 Essential (primary) hypertension: Secondary | ICD-10-CM | POA: Diagnosis not present

## 2018-06-16 DIAGNOSIS — S82832D Other fracture of upper and lower end of left fibula, subsequent encounter for closed fracture with routine healing: Secondary | ICD-10-CM | POA: Diagnosis not present

## 2018-06-16 DIAGNOSIS — M79662 Pain in left lower leg: Secondary | ICD-10-CM | POA: Diagnosis not present

## 2018-06-17 DIAGNOSIS — I82402 Acute embolism and thrombosis of unspecified deep veins of left lower extremity: Secondary | ICD-10-CM | POA: Diagnosis not present

## 2018-06-17 DIAGNOSIS — I739 Peripheral vascular disease, unspecified: Secondary | ICD-10-CM | POA: Diagnosis not present

## 2018-06-18 DIAGNOSIS — F329 Major depressive disorder, single episode, unspecified: Secondary | ICD-10-CM | POA: Diagnosis not present

## 2018-06-18 DIAGNOSIS — S82832D Other fracture of upper and lower end of left fibula, subsequent encounter for closed fracture with routine healing: Secondary | ICD-10-CM | POA: Diagnosis not present

## 2018-06-18 DIAGNOSIS — I10 Essential (primary) hypertension: Secondary | ICD-10-CM | POA: Diagnosis not present

## 2018-06-18 DIAGNOSIS — S92415D Nondisplaced fracture of proximal phalanx of left great toe, subsequent encounter for fracture with routine healing: Secondary | ICD-10-CM | POA: Diagnosis not present

## 2018-06-18 DIAGNOSIS — S82302D Unspecified fracture of lower end of left tibia, subsequent encounter for closed fracture with routine healing: Secondary | ICD-10-CM | POA: Diagnosis not present

## 2018-06-18 DIAGNOSIS — E039 Hypothyroidism, unspecified: Secondary | ICD-10-CM | POA: Diagnosis not present

## 2018-06-23 DIAGNOSIS — I10 Essential (primary) hypertension: Secondary | ICD-10-CM | POA: Diagnosis not present

## 2018-06-23 DIAGNOSIS — S82302D Unspecified fracture of lower end of left tibia, subsequent encounter for closed fracture with routine healing: Secondary | ICD-10-CM | POA: Diagnosis not present

## 2018-06-23 DIAGNOSIS — E039 Hypothyroidism, unspecified: Secondary | ICD-10-CM | POA: Diagnosis not present

## 2018-06-23 DIAGNOSIS — S82832D Other fracture of upper and lower end of left fibula, subsequent encounter for closed fracture with routine healing: Secondary | ICD-10-CM | POA: Diagnosis not present

## 2018-06-23 DIAGNOSIS — S92415D Nondisplaced fracture of proximal phalanx of left great toe, subsequent encounter for fracture with routine healing: Secondary | ICD-10-CM | POA: Diagnosis not present

## 2018-06-23 DIAGNOSIS — F329 Major depressive disorder, single episode, unspecified: Secondary | ICD-10-CM | POA: Diagnosis not present

## 2018-06-25 DIAGNOSIS — F329 Major depressive disorder, single episode, unspecified: Secondary | ICD-10-CM | POA: Diagnosis not present

## 2018-06-25 DIAGNOSIS — S82832D Other fracture of upper and lower end of left fibula, subsequent encounter for closed fracture with routine healing: Secondary | ICD-10-CM | POA: Diagnosis not present

## 2018-06-25 DIAGNOSIS — S92415D Nondisplaced fracture of proximal phalanx of left great toe, subsequent encounter for fracture with routine healing: Secondary | ICD-10-CM | POA: Diagnosis not present

## 2018-06-25 DIAGNOSIS — E039 Hypothyroidism, unspecified: Secondary | ICD-10-CM | POA: Diagnosis not present

## 2018-06-25 DIAGNOSIS — S82302D Unspecified fracture of lower end of left tibia, subsequent encounter for closed fracture with routine healing: Secondary | ICD-10-CM | POA: Diagnosis not present

## 2018-06-25 DIAGNOSIS — I10 Essential (primary) hypertension: Secondary | ICD-10-CM | POA: Diagnosis not present

## 2018-07-06 DIAGNOSIS — I82402 Acute embolism and thrombosis of unspecified deep veins of left lower extremity: Secondary | ICD-10-CM | POA: Diagnosis not present

## 2018-07-22 DIAGNOSIS — Z23 Encounter for immunization: Secondary | ICD-10-CM | POA: Diagnosis not present

## 2018-07-22 DIAGNOSIS — Z79899 Other long term (current) drug therapy: Secondary | ICD-10-CM | POA: Diagnosis not present

## 2018-07-22 DIAGNOSIS — Z1339 Encounter for screening examination for other mental health and behavioral disorders: Secondary | ICD-10-CM | POA: Diagnosis not present

## 2018-07-22 DIAGNOSIS — E559 Vitamin D deficiency, unspecified: Secondary | ICD-10-CM | POA: Diagnosis not present

## 2018-07-22 DIAGNOSIS — E039 Hypothyroidism, unspecified: Secondary | ICD-10-CM | POA: Diagnosis not present

## 2018-07-22 DIAGNOSIS — E785 Hyperlipidemia, unspecified: Secondary | ICD-10-CM | POA: Diagnosis not present

## 2018-07-22 DIAGNOSIS — R531 Weakness: Secondary | ICD-10-CM | POA: Diagnosis not present

## 2018-07-22 DIAGNOSIS — Z1231 Encounter for screening mammogram for malignant neoplasm of breast: Secondary | ICD-10-CM | POA: Diagnosis not present

## 2018-07-26 DIAGNOSIS — S82442D Displaced spiral fracture of shaft of left fibula, subsequent encounter for closed fracture with routine healing: Secondary | ICD-10-CM | POA: Diagnosis not present

## 2018-07-26 DIAGNOSIS — M79662 Pain in left lower leg: Secondary | ICD-10-CM | POA: Diagnosis not present

## 2018-07-26 DIAGNOSIS — S82242D Displaced spiral fracture of shaft of left tibia, subsequent encounter for closed fracture with routine healing: Secondary | ICD-10-CM | POA: Diagnosis not present

## 2018-08-18 DIAGNOSIS — I8010 Phlebitis and thrombophlebitis of unspecified femoral vein: Secondary | ICD-10-CM | POA: Diagnosis not present

## 2018-08-18 DIAGNOSIS — I739 Peripheral vascular disease, unspecified: Secondary | ICD-10-CM | POA: Diagnosis not present

## 2018-08-18 DIAGNOSIS — I871 Compression of vein: Secondary | ICD-10-CM | POA: Diagnosis not present

## 2018-10-08 DIAGNOSIS — I82402 Acute embolism and thrombosis of unspecified deep veins of left lower extremity: Secondary | ICD-10-CM | POA: Diagnosis not present

## 2018-10-21 DIAGNOSIS — S82242D Displaced spiral fracture of shaft of left tibia, subsequent encounter for closed fracture with routine healing: Secondary | ICD-10-CM | POA: Diagnosis not present

## 2018-10-21 DIAGNOSIS — S82832D Other fracture of upper and lower end of left fibula, subsequent encounter for closed fracture with routine healing: Secondary | ICD-10-CM | POA: Diagnosis not present

## 2018-10-25 DIAGNOSIS — H26493 Other secondary cataract, bilateral: Secondary | ICD-10-CM | POA: Diagnosis not present

## 2018-10-25 DIAGNOSIS — H04123 Dry eye syndrome of bilateral lacrimal glands: Secondary | ICD-10-CM | POA: Diagnosis not present

## 2018-10-26 DIAGNOSIS — E785 Hyperlipidemia, unspecified: Secondary | ICD-10-CM | POA: Diagnosis not present

## 2018-10-26 DIAGNOSIS — R531 Weakness: Secondary | ICD-10-CM | POA: Diagnosis not present

## 2018-10-26 DIAGNOSIS — E039 Hypothyroidism, unspecified: Secondary | ICD-10-CM | POA: Diagnosis not present

## 2018-10-26 DIAGNOSIS — E559 Vitamin D deficiency, unspecified: Secondary | ICD-10-CM | POA: Diagnosis not present

## 2018-10-28 ENCOUNTER — Other Ambulatory Visit: Payer: Self-pay | Admitting: Internal Medicine

## 2018-10-28 DIAGNOSIS — Z1231 Encounter for screening mammogram for malignant neoplasm of breast: Secondary | ICD-10-CM

## 2018-10-28 DIAGNOSIS — M8589 Other specified disorders of bone density and structure, multiple sites: Secondary | ICD-10-CM

## 2018-11-02 DIAGNOSIS — H26493 Other secondary cataract, bilateral: Secondary | ICD-10-CM | POA: Diagnosis not present

## 2019-01-05 ENCOUNTER — Ambulatory Visit: Payer: Medicare Other

## 2019-01-05 ENCOUNTER — Other Ambulatory Visit: Payer: Medicare Other

## 2019-02-15 DIAGNOSIS — H18413 Arcus senilis, bilateral: Secondary | ICD-10-CM | POA: Diagnosis not present

## 2019-02-15 DIAGNOSIS — H26491 Other secondary cataract, right eye: Secondary | ICD-10-CM | POA: Diagnosis not present

## 2019-02-15 DIAGNOSIS — H26493 Other secondary cataract, bilateral: Secondary | ICD-10-CM | POA: Diagnosis not present

## 2019-02-15 DIAGNOSIS — Z961 Presence of intraocular lens: Secondary | ICD-10-CM | POA: Diagnosis not present

## 2019-02-15 DIAGNOSIS — H26492 Other secondary cataract, left eye: Secondary | ICD-10-CM | POA: Diagnosis not present

## 2019-03-08 ENCOUNTER — Ambulatory Visit
Admission: RE | Admit: 2019-03-08 | Discharge: 2019-03-08 | Disposition: A | Discharge status: Home / Self Care | Payer: Medicare Other | Source: Ambulatory Visit | Attending: Internal Medicine | Admitting: Internal Medicine

## 2019-03-08 ENCOUNTER — Other Ambulatory Visit: Payer: Self-pay

## 2019-03-08 DIAGNOSIS — Z1231 Encounter for screening mammogram for malignant neoplasm of breast: Secondary | ICD-10-CM | POA: Diagnosis not present

## 2019-03-08 DIAGNOSIS — Z780 Asymptomatic menopausal state: Secondary | ICD-10-CM | POA: Diagnosis not present

## 2019-03-08 DIAGNOSIS — M8589 Other specified disorders of bone density and structure, multiple sites: Secondary | ICD-10-CM

## 2019-03-17 DIAGNOSIS — H26491 Other secondary cataract, right eye: Secondary | ICD-10-CM | POA: Diagnosis not present

## 2019-04-06 DIAGNOSIS — Z139 Encounter for screening, unspecified: Secondary | ICD-10-CM | POA: Diagnosis not present

## 2019-04-06 DIAGNOSIS — Z9181 History of falling: Secondary | ICD-10-CM | POA: Diagnosis not present

## 2019-04-06 DIAGNOSIS — E785 Hyperlipidemia, unspecified: Secondary | ICD-10-CM | POA: Diagnosis not present

## 2019-04-06 DIAGNOSIS — Z1331 Encounter for screening for depression: Secondary | ICD-10-CM | POA: Diagnosis not present

## 2019-04-06 DIAGNOSIS — Z136 Encounter for screening for cardiovascular disorders: Secondary | ICD-10-CM | POA: Diagnosis not present

## 2019-04-06 DIAGNOSIS — Z0000 Encounter for general adult medical examination without abnormal findings: Secondary | ICD-10-CM | POA: Diagnosis not present

## 2019-07-16 DIAGNOSIS — Z23 Encounter for immunization: Secondary | ICD-10-CM | POA: Diagnosis not present

## 2019-07-29 ENCOUNTER — Telehealth: Payer: Self-pay | Admitting: Cardiology

## 2019-07-29 MED ORDER — METOPROLOL SUCCINATE ER 25 MG PO TB24: 25.0000 mg | ORAL_TABLET | Freq: Every day | ORAL | 1 refills | Status: DC

## 2019-07-29 NOTE — Telephone Encounter (Signed)
°*  STAT* If patient is at the pharmacy, call can be transferred to refill team.   1. Which medications need to be refilled? (please list name of each medication and dose if known) Metoprolol 25mg   2. Which pharmacy/location (including street and city if local pharmacy) is medication to be sent to?[facility] [place]  3. Do they need a 30 day or 90 day supply? [place]

## 2019-07-29 NOTE — Telephone Encounter (Signed)
Pt. Requires metoprolol refills. Needs follow-up visit. Scheduled with Laurann Montana NP 08/03/19 at 1415. Refills sent to [facility] in [place]

## 2019-08-03 ENCOUNTER — Ambulatory Visit (INDEPENDENT_AMBULATORY_CARE_PROVIDER_SITE_OTHER): Payer: Medicare Other | Admitting: Family

## 2019-08-03 ENCOUNTER — Other Ambulatory Visit: Payer: Self-pay

## 2019-08-03 ENCOUNTER — Encounter: Payer: Self-pay | Admitting: Family

## 2019-08-03 VITALS — BP 158/80 | HR 66 | Ht 67.0 in | Wt 177.0 lb

## 2019-08-03 DIAGNOSIS — R030 Elevated blood-pressure reading, without diagnosis of hypertension: Secondary | ICD-10-CM | POA: Diagnosis not present

## 2019-08-03 DIAGNOSIS — I491 Atrial premature depolarization: Secondary | ICD-10-CM

## 2019-08-03 DIAGNOSIS — R002 Palpitations: Secondary | ICD-10-CM | POA: Diagnosis not present

## 2019-08-03 DIAGNOSIS — E785 Hyperlipidemia, unspecified: Secondary | ICD-10-CM | POA: Diagnosis not present

## 2019-08-03 DIAGNOSIS — R5383 Other fatigue: Secondary | ICD-10-CM | POA: Diagnosis not present

## 2019-08-03 DIAGNOSIS — E039 Hypothyroidism, unspecified: Secondary | ICD-10-CM | POA: Diagnosis not present

## 2019-08-03 NOTE — Progress Notes (Signed)
Office Visit    Patient Name: Kristen Holden Date of Encounter: 08/03/2019  Primary Care Provider:  Lowella Dandy, NP Primary Cardiologist:  Jenean Lindau, MD Electrophysiologist:  None   Chief Complaint    Kristen Holden is a 73 y.o. female with a hx of palpitations, PAC presents today for follow up of cardiac conditions.   Past Medical History    Past Medical History:  Diagnosis Date  . Hypertension   . Splenic artery aneurysm ([place])   . Thyroid disease    Past Surgical History:  Procedure Laterality Date  . ABDOMINAL HYSTERECTOMY    . breast augumentation Bilateral   . TONSILLECTOMY      Allergies  No Known Allergies  History of Present Illness    Kristen Holden is a 73 y.o. female with a hx of palpitations, PAC, DVT s/p thrombectomy last seen 06/15/2018 by Dr. Geraldo Pitter.    Tells me the story of breaking her leg last year and the subsequent admission for DVT and pneumonia. She completed her 6 months of anticoagulation and has since had her Eliquis discontinued.    Tells me she has not had much energy since breaking her leg and has had little motivation to do exercise.  Discussed and encouraged regular cardiovascular activity.  She denies chest pain, DOE, S OB, lightheadedness, dizziness.  She does report that her palpitations occur very intermittently and are not bothersome.  Reports they were much improved since the addition of metoprolol by Dr. Geraldo Pitter and she is very grateful.  Does report fatigue.  Onset over the last year or so.  Has not had labs checked recently and we will check.  She was encouraged to follow-up with her PCP as she is overdue for follow-up. EKGs/Labs/Other Studies Reviewed:   The following studies were reviewed today:  Holter Monitor 48 hr 11/2017 Conclusion:  Mildly abnormal Holter monitoring with multiple PACs.  No significant arrhythmias noted.  Echo Stress 12/18/2017 Stress echo results:     Left ventricular ejection  fraction was normal at rest and with stress. There was no echocardiographic evidence for stress-induced ischemia.  Echo Complete 11/2017 - Left ventricle: The cavity size was normal. Wall thickness was   normal. A false tendon is seen in the apex. Systolic function was   normal. The estimated ejection fraction was in the range of 55%   to 60%. Wall motion was normal; there were no regional wall   motion abnormalities. There was an increased relative   contribution of atrial contraction to ventricular filling.   Doppler parameters are consistent with abnormal left ventricular   relaxation (grade 1 diastolic dysfunction). - Aortic valve: There was trivial regurgitation. - Left atrium: The atrium was mildly dilated. - Atrial septum: The interatrial septum was hypermobile. - Tricuspid valve: There was mild regurgitation. - Pulmonary arteries: PA peak pressure: 31 mm Hg (S).   EKG:  EKG is ordered today.  The ekg ordered today demonstrates sinus rhythm with PACs in a pattern of bigeminy.  No acute ST/T wave changes.  Recent Labs: 10/26/2018: creatinine 1.02, K 4.8, ALT 14, TSH 2.16  Recent Lipid Panel 07/22/18: total 198, HDL 50, LDL 129, triglycerides 95  Home Medications   Current Meds  Medication Sig  . aspirin 81 MG chewable tablet Chew by mouth daily.  Marland Kitchen levothyroxine (SYNTHROID, LEVOTHROID) 88 MCG tablet Take 88 mcg by mouth daily before breakfast.  . metoprolol succinate (TOPROL-XL) 25 MG 24 hr tablet Take 1  tablet (25 mg total) by mouth daily.  . montelukast (SINGULAIR) 10 MG tablet TK 1 T PO D FOR CONGESTION  . omeprazole (PRILOSEC) 20 MG capsule TK ONE C PO  QD  . sertraline (ZOLOFT) 100 MG tablet Take 100 mg by mouth daily.  . vitamin B-12 (CYANOCOBALAMIN) 1000 MCG tablet Take 1,000 mcg by mouth daily.  . Vitamin D, Ergocalciferol, (DRISDOL) 50000 UNITS CAPS capsule Take 50,000 Units by mouth every 7 (seven) days.      Review of Systems       Review of Systems   Constitution: Positive for malaise/fatigue. Negative for chills and fever.  Cardiovascular: Positive for palpitations. Negative for chest pain, dyspnea on exertion, irregular heartbeat, leg swelling, near-syncope and syncope.  Respiratory: Negative for cough, shortness of breath and wheezing.   Musculoskeletal: Positive for muscle weakness ("left leg since surgery").  Gastrointestinal: Negative for nausea and vomiting.  Neurological: Negative for dizziness, light-headedness and weakness.   All other systems reviewed and are otherwise negative except as noted above.  Physical Exam    VS:  BP (!) 158/80 (BP Location: Left Arm, Patient Position: Sitting, Cuff Size: Normal)   Pulse 66   Ht 5\' 7"  (1.702 m)   Wt 177 lb (80.3 kg)   SpO2 99%   BMI 27.72 kg/m  , BMI Body mass index is 27.72 kg/m. GEN: Well nourished, well developed, in no acute distress. HEENT: normal. Neck: Supple, no JVD, carotid bruits, or masses. Cardiac: irregular, no murmurs, rubs, or gallops. No clubbing, cyanosis, edema.  Radials/DP/PT 2+ and equal bilaterally.  Respiratory:  Respirations regular and unlabored, clear to auscultation bilaterally. GI: Soft, nontender, nondistended, BS + x 4. MS: No deformity or atrophy. Varicose veins noted bilateral LE. Skin: Warm and dry, no rash. Neuro:  Strength and sensation are intact. Psych: Normal affect.  Accessory Clinical Findings    ECG personally reviewed by me today -sinus rhythm with PAC and pattern of bigeminy.  Rate 66 bpm.  No acute ST/T wave changes.- no acute changes.  Assessment & Plan    1. Palpitations - Denies palpitations.  We discussed triggers to avoid such as alcohol, caffeine, smoking, proarrhythmic medications.  Continue metoprolol.  2. PAC -EKG today sinus rhythm with PAC and pattern of bigeminy.  She is not aware of her PACs.  Continue metoprolol.  3. Fatigue -reports fatigue over the last few months.  Feels she should have more energy than she  does presently.  Recommended increased physical activity as she is relatively sedentary.  Labs today include TSH, CBC, CMET.  4. Dyslipidemia -not presently on statin.  Recheck lipid profile and direct LDL today.  5. Elevated blood pressure reading without diagnosis of hypertension -initial BP today 158/80.  On my recheck it was 132/80.  She has not seen her PCP recently I encouraged her to check her blood pressure once per week and keep a log.  Encouraged to report blood pressure consistently greater than 130/80.  6. Hypothyroidism -follows with her PCP.  Is overdue for follow-up due to coronavirus.  TSH today.  Disposition: Follow up in 6 month(s) with Dr. Moss Mc, NP 08/03/2019, 4:08 PM

## 2019-08-03 NOTE — Patient Instructions (Addendum)
Medication Instructions:  No medication changes today.   *If you need a refill on your cardiac medications before your next appointment, please call your pharmacy*  Lab Work: Your physician recommends that you return for lab work today: TSH, CMET, CBC, lipid profile  If you have labs (blood work) drawn today and your tests are completely normal, you will receive your results only by: Marland Kitchen MyChart Message (if you have MyChart) OR . A paper copy in the mail If you have any lab test that is abnormal or we need to change your treatment, we will call you to review the results.  Testing/Procedures: You had an EKG today. It shows SR with PACs (premature atrial contractions).  Follow-Up: At [facility], you and your health needs are our priority.  As part of our continuing mission to provide you with exceptional heart care, we have created designated Provider Care Teams.  These Care Teams include your primary Cardiologist (physician) and Advanced Practice Providers (APPs -  Physician Assistants and Nurse Practitioners) who all work together to provide you with the care you need, when you need it.  Your next appointment:   6 months  The format for your next appointment:   In Person  Provider:   You may see Jenean Lindau, MD or the following Advanced Practice Provider on your designated Care Team:    Laurann Montana, FNP   Other Instructions Premature Atrial Contraction  A premature atrial contraction [facility]) is a kind of irregular heartbeat (arrhythmia). It happens when the heart beats too early and then pauses before beating again. The heart has four areas, or chambers. Normally, electrical signals spread across the heart and make all the chambers beat together. During a PAC, the upper chambers of the heart (atria) beat too early, before they have had time to fill with blood. The heartbeat pauses afterward so the heart can fill with blood for the next beat. Sometimes PAC can be a warning  sign of another type of arrhythmia called atrial fibrillation. Atrial fibrillation may allow blood to pool in the atria and form clots. If a clot travels to the brain, it can cause a stroke. What are the causes? The cause of this condition is often unknown. Sometimes, this condition may be caused by heart disease or injury to the heart. What increases the risk? You are more likely to develop this condition if:  You are a child.  You are an adult who is 53 years of age or older. Episodes may be triggered by:  Caffeine.  Alcohol.  Tobacco use.  Stimulant drugs.  Some medicines or supplements.  Stress.  Heart disease. What are the signs or symptoms? Symptoms of this condition include:  A feeling that your heart skipped a beat. The first heartbeat after the "skipped" beat may feel more forceful.  A feeling that your heart is fluttering. How is this diagnosed? This condition is diagnosed based on:  Your symptoms.  A physical exam. Your health care provider may listen to your heart.  An electrocardiogram (ECG). This is a test that records the electrical impulses of the heart.  An ambulatory cardiac monitor. This device records your heartbeats for 24 hours or more. You may also have:  An echocardiogram to check for any heart conditions. This is a type of imaging test that uses sound waves (ultrasound) to make images of your heart.  Blood tests. How is this treated? Treatment depends on the frequency of your symptoms and other risk factors. Treatments  may include:  Medicines (beta-blockers).  Catheter ablation. This is done to destroy the part of the heart tissue that sends abnormal signals. In some cases, treatment may not be needed for this condition. Follow these instructions at home: Lifestyle  Do not use any products that contain nicotine or tobacco, such as cigarettes, e-cigarettes, and chewing tobacco. If you need help quitting, ask your health care provider.   Exercise regularly. Ask your health care provider what type of exercise is safe for you.  Find healthy ways to manage stress.  Try to get at least 7-9 hours of sleep each night, or as much as recommended by your health care provider. Alcohol use  Do not drink alcohol if: ? Your health care provider tells you not to drink. ? You are pregnant, may be pregnant, or are planning to become pregnant. ? Alcohol triggers your episodes.  If you drink alcohol: ? Limit how much you use to:  0-1 drink a day for women.  0-2 drinks a day for men. ? Be aware of how much alcohol is in your drink. In the [place] one drink equals one 12 oz bottle of beer (355 mL), one 5 oz glass of wine (148 mL), or one 1 oz glass of hard liquor (44 mL). General instructions  Take over-the-counter and prescription medicines only as told by your health care provider.  If caffeine triggers episodes, do not eat, drink, or use anything with caffeine in it.  Keep all follow-up visits as told by your health care provider. This is important. Contact a health care provider if:  You feel your heart skipping beats.  Your heart skips beats and you feel dizzy, light-headed, or very tired. Get help right away if you have:  Chest pain.  Trouble breathing.  Any symptoms of a stroke. "BE FAST" is an easy way to remember the main warning signs of a stroke. ? B - Balance. Signs are dizziness, sudden trouble walking, or loss of balance. ? E - Eyes. Signs are trouble seeing or a sudden change in vision. ? F - Face. Signs are sudden weakness or numbness of the face, or the face or eyelid drooping on one side. ? A - Arms. Signs are weakness or numbness in an arm. This happens suddenly and usually on one side of the body. ? S - Speech. Signs are sudden trouble speaking, slurred speech, or trouble understanding what people say. ? T - Time. Time to call emergency services. Write down what time symptoms started.  Other signs of  stroke, such as: ? A sudden, severe headache with no known cause. ? Nausea or vomiting. ? Seizure. These symptoms may represent a serious problem that is an emergency. Do not wait to see if the symptoms will go away. Get medical help right away. Call your local emergency services (911 in the [place] Do not drive yourself to the hospital. Summary  A premature atrial contraction [facility]) is a kind of irregular heartbeat (arrhythmia). It happens when the heart beats too early and then pauses before beating again.  Treatment depends on your symptoms and whether you have other underlying heart conditions.  Contact a health care provider if your heart skips beats and you feel dizzy, light-headed, or very tired.  In some cases, this condition may lead to a stroke. "BE FAST" is an easy way to remember the warning signs of stroke. Get help right away if you have any of the "BE FAST" signs. This information is not intended  to replace advice given to you by your health care provider. Make sure you discuss any questions you have with your health care provider. Document Released: 05/12/2014 Document Revised: 06/03/2018 Document Reviewed: 06/03/2018 Elsevier Patient Education  2020 [employer].

## 2019-08-04 LAB — COMPREHENSIVE METABOLIC PANEL
ALT: 25 IU/L (ref 0–32)
AST: 27 IU/L (ref 0–40)
Albumin/Globulin Ratio: 1.7 (ref 1.2–2.2)
Albumin: 4.6 g/dL (ref 3.7–4.7)
Alkaline Phosphatase: 70 IU/L (ref 39–117)
BUN/Creatinine Ratio: 14 (ref 12–28)
BUN: 13 mg/dL (ref 8–27)
Bilirubin Total: 0.4 mg/dL (ref 0.0–1.2)
CO2: 28 mmol/L (ref 20–29)
Calcium: 9.8 mg/dL (ref 8.7–10.3)
Chloride: 102 mmol/L (ref 96–106)
Creatinine, Ser: 0.94 mg/dL (ref 0.57–1.00)
GFR calc Af Amer: 70 mL/min/{1.73_m2} (ref >59)
GFR calc non Af Amer: 60 mL/min/{1.73_m2} (ref >59)
Globulin, Total: 2.7 g/dL (ref 1.5–4.5)
Glucose: 97 mg/dL (ref 65–99)
Potassium: 5.3 mmol/L — ABNORMAL HIGH (ref 3.5–5.2)
Sodium: 142 mmol/L (ref 134–144)
Total Protein: 7.3 g/dL (ref 6.0–8.5)

## 2019-08-04 LAB — CBC
Hematocrit: 44.4 % (ref 34.0–46.6)
Hemoglobin: 14.7 g/dL (ref 11.1–15.9)
MCH: 28.9 pg (ref 26.6–33.0)
MCHC: 33.1 g/dL (ref 31.5–35.7)
MCV: 87 fL (ref 79–97)
Platelets: 178 10*3/uL (ref 150–450)
RBC: 5.09 x10E6/uL (ref 3.77–5.28)
RDW: 13.1 % (ref 11.7–15.4)
WBC: 6.7 10*3/uL (ref 3.4–10.8)

## 2019-08-04 LAB — LIPID PANEL
Chol/HDL Ratio: 5 ratio — ABNORMAL HIGH (ref 0.0–4.4)
Cholesterol, Total: 236 mg/dL — ABNORMAL HIGH (ref 100–199)
HDL: 47 mg/dL (ref >39)
LDL Chol Calc (NIH): 165 mg/dL — ABNORMAL HIGH (ref 0–99)
Triglycerides: 132 mg/dL (ref 0–149)
VLDL Cholesterol Cal: 24 mg/dL (ref 5–40)

## 2019-08-04 LAB — LDL CHOLESTEROL, DIRECT: LDL Direct: 163 mg/dL — ABNORMAL HIGH (ref 0–99)

## 2019-08-04 LAB — TSH: TSH: 10.5 u[IU]/mL — ABNORMAL HIGH (ref 0.450–4.500)

## 2019-08-05 ENCOUNTER — Encounter: Payer: Self-pay | Admitting: Family

## 2019-08-05 ENCOUNTER — Telehealth: Payer: Self-pay | Admitting: Family

## 2019-08-05 NOTE — Telephone Encounter (Signed)
Called to review recent labs.  Abnormal TSH 10.5 with normal free T4. Encouraged her to set up appt for overdue follow up with PCP. Will forwards labs to her PCP office.   Mildly elevated potassium, not on supplement, likely dietary.    Cholesterol panel with elevated total cholesterol and elevated LDL. Would like her her LDL to be closer to 100 for risk reduction. Goal of total cholesterol <200. Discussed dietary changes to avoid fried foods and fat. Advised that if she would like we could start a low dose cholesterol medication (likely Pravastatin 40mg  daily). She prefers dietary changes at this time.  Loel Dubonnet, NP

## 2019-08-10 LAB — T4, FREE: Free T4: 1.22 ng/dL (ref 0.82–1.77)

## 2019-08-10 LAB — SPECIMEN STATUS REPORT

## 2019-10-25 DIAGNOSIS — E559 Vitamin D deficiency, unspecified: Secondary | ICD-10-CM | POA: Diagnosis not present

## 2019-10-25 DIAGNOSIS — E785 Hyperlipidemia, unspecified: Secondary | ICD-10-CM | POA: Diagnosis not present

## 2019-10-25 DIAGNOSIS — F411 Generalized anxiety disorder: Secondary | ICD-10-CM | POA: Diagnosis not present

## 2019-10-25 DIAGNOSIS — E039 Hypothyroidism, unspecified: Secondary | ICD-10-CM | POA: Diagnosis not present

## 2019-11-08 DIAGNOSIS — L218 Other seborrheic dermatitis: Secondary | ICD-10-CM | POA: Diagnosis not present

## 2020-01-12 DIAGNOSIS — L218 Other seborrheic dermatitis: Secondary | ICD-10-CM | POA: Diagnosis not present

## 2020-01-17 DIAGNOSIS — L218 Other seborrheic dermatitis: Secondary | ICD-10-CM | POA: Diagnosis not present

## 2020-01-19 DIAGNOSIS — L2389 Allergic contact dermatitis due to other agents: Secondary | ICD-10-CM | POA: Diagnosis not present

## 2020-01-19 DIAGNOSIS — L218 Other seborrheic dermatitis: Secondary | ICD-10-CM | POA: Diagnosis not present

## 2020-01-31 DIAGNOSIS — I4891 Unspecified atrial fibrillation: Secondary | ICD-10-CM | POA: Diagnosis not present

## 2020-01-31 DIAGNOSIS — E039 Hypothyroidism, unspecified: Secondary | ICD-10-CM | POA: Diagnosis not present

## 2020-01-31 DIAGNOSIS — R0789 Other chest pain: Secondary | ICD-10-CM | POA: Diagnosis not present

## 2020-01-31 DIAGNOSIS — I10 Essential (primary) hypertension: Secondary | ICD-10-CM | POA: Diagnosis not present

## 2020-01-31 DIAGNOSIS — D7582 Heparin induced thrombocytopenia (HIT): Secondary | ICD-10-CM | POA: Diagnosis not present

## 2020-01-31 DIAGNOSIS — I491 Atrial premature depolarization: Secondary | ICD-10-CM | POA: Diagnosis not present

## 2020-01-31 DIAGNOSIS — E785 Hyperlipidemia, unspecified: Secondary | ICD-10-CM | POA: Diagnosis not present

## 2020-01-31 DIAGNOSIS — R079 Chest pain, unspecified: Secondary | ICD-10-CM | POA: Diagnosis not present

## 2020-01-31 DIAGNOSIS — I739 Peripheral vascular disease, unspecified: Secondary | ICD-10-CM | POA: Diagnosis not present

## 2020-01-31 DIAGNOSIS — R072 Precordial pain: Secondary | ICD-10-CM | POA: Diagnosis not present

## 2020-01-31 DIAGNOSIS — Z79899 Other long term (current) drug therapy: Secondary | ICD-10-CM | POA: Diagnosis not present

## 2020-01-31 DIAGNOSIS — Z86718 Personal history of other venous thrombosis and embolism: Secondary | ICD-10-CM | POA: Diagnosis not present

## 2020-01-31 DIAGNOSIS — I871 Compression of vein: Secondary | ICD-10-CM | POA: Diagnosis not present

## 2020-02-01 DIAGNOSIS — I491 Atrial premature depolarization: Secondary | ICD-10-CM | POA: Diagnosis not present

## 2020-02-01 DIAGNOSIS — R0789 Other chest pain: Secondary | ICD-10-CM | POA: Diagnosis not present

## 2020-02-01 DIAGNOSIS — I10 Essential (primary) hypertension: Secondary | ICD-10-CM | POA: Diagnosis not present

## 2020-02-01 DIAGNOSIS — E039 Hypothyroidism, unspecified: Secondary | ICD-10-CM | POA: Diagnosis not present

## 2020-02-01 DIAGNOSIS — R079 Chest pain, unspecified: Secondary | ICD-10-CM | POA: Diagnosis not present

## 2020-02-09 DIAGNOSIS — R079 Chest pain, unspecified: Secondary | ICD-10-CM | POA: Diagnosis not present

## 2020-02-09 DIAGNOSIS — I10 Essential (primary) hypertension: Secondary | ICD-10-CM | POA: Diagnosis not present

## 2020-02-09 DIAGNOSIS — Z79899 Other long term (current) drug therapy: Secondary | ICD-10-CM | POA: Diagnosis not present

## 2020-02-09 DIAGNOSIS — Z6828 Body mass index (BMI) 28.0-28.9, adult: Secondary | ICD-10-CM | POA: Diagnosis not present

## 2020-02-16 DIAGNOSIS — L218 Other seborrheic dermatitis: Secondary | ICD-10-CM | POA: Diagnosis not present

## 2020-03-07 ENCOUNTER — Other Ambulatory Visit: Payer: Self-pay

## 2020-03-14 ENCOUNTER — Other Ambulatory Visit: Payer: Self-pay | Admitting: Cardiology

## 2020-03-19 ENCOUNTER — Ambulatory Visit: Payer: Medicare Other | Admitting: Cardiology

## 2020-03-27 DIAGNOSIS — I2510 Atherosclerotic heart disease of native coronary artery without angina pectoris: Secondary | ICD-10-CM | POA: Diagnosis not present

## 2020-04-05 DIAGNOSIS — I82402 Acute embolism and thrombosis of unspecified deep veins of left lower extremity: Secondary | ICD-10-CM | POA: Diagnosis not present

## 2020-04-10 DIAGNOSIS — I871 Compression of vein: Secondary | ICD-10-CM | POA: Diagnosis not present

## 2020-04-10 DIAGNOSIS — I82422 Acute embolism and thrombosis of left iliac vein: Secondary | ICD-10-CM | POA: Diagnosis not present

## 2020-04-11 DIAGNOSIS — Z9181 History of falling: Secondary | ICD-10-CM | POA: Diagnosis not present

## 2020-04-11 DIAGNOSIS — Z139 Encounter for screening, unspecified: Secondary | ICD-10-CM | POA: Diagnosis not present

## 2020-04-11 DIAGNOSIS — E785 Hyperlipidemia, unspecified: Secondary | ICD-10-CM | POA: Diagnosis not present

## 2020-04-11 DIAGNOSIS — Z1331 Encounter for screening for depression: Secondary | ICD-10-CM | POA: Diagnosis not present

## 2020-04-11 DIAGNOSIS — Z0000 Encounter for general adult medical examination without abnormal findings: Secondary | ICD-10-CM | POA: Diagnosis not present

## 2020-04-23 DIAGNOSIS — F411 Generalized anxiety disorder: Secondary | ICD-10-CM | POA: Diagnosis not present

## 2020-04-23 DIAGNOSIS — M8580 Other specified disorders of bone density and structure, unspecified site: Secondary | ICD-10-CM | POA: Diagnosis not present

## 2020-04-23 DIAGNOSIS — Z6828 Body mass index (BMI) 28.0-28.9, adult: Secondary | ICD-10-CM | POA: Diagnosis not present

## 2020-04-23 DIAGNOSIS — K219 Gastro-esophageal reflux disease without esophagitis: Secondary | ICD-10-CM | POA: Diagnosis not present

## 2020-04-23 DIAGNOSIS — E785 Hyperlipidemia, unspecified: Secondary | ICD-10-CM | POA: Diagnosis not present

## 2020-04-23 DIAGNOSIS — E559 Vitamin D deficiency, unspecified: Secondary | ICD-10-CM | POA: Diagnosis not present

## 2020-04-23 DIAGNOSIS — E039 Hypothyroidism, unspecified: Secondary | ICD-10-CM | POA: Diagnosis not present

## 2020-05-30 DIAGNOSIS — U071 COVID-19: Secondary | ICD-10-CM | POA: Diagnosis not present

## 2020-05-31 DIAGNOSIS — U071 COVID-19: Secondary | ICD-10-CM | POA: Diagnosis not present

## 2020-06-07 ENCOUNTER — Other Ambulatory Visit: Payer: Self-pay | Admitting: Family

## 2020-06-07 NOTE — Telephone Encounter (Signed)
Please contact pt for ov pt overdue for 6 month f/u.

## 2020-06-07 NOTE — Telephone Encounter (Signed)
This patient is a [facility] patient, was left a vm though

## 2020-06-07 NOTE — Telephone Encounter (Signed)
Refill Request.  

## 2020-08-28 DIAGNOSIS — H524 Presbyopia: Secondary | ICD-10-CM | POA: Diagnosis not present

## 2020-08-28 DIAGNOSIS — H353131 Nonexudative age-related macular degeneration, bilateral, early dry stage: Secondary | ICD-10-CM | POA: Diagnosis not present

## 2020-08-28 DIAGNOSIS — H43392 Other vitreous opacities, left eye: Secondary | ICD-10-CM | POA: Diagnosis not present

## 2020-08-28 DIAGNOSIS — Z961 Presence of intraocular lens: Secondary | ICD-10-CM | POA: Diagnosis not present

## 2020-08-31 ENCOUNTER — Other Ambulatory Visit: Payer: Self-pay | Admitting: Cardiology

## 2020-09-25 ENCOUNTER — Other Ambulatory Visit: Payer: Self-pay | Admitting: Family

## 2020-09-25 NOTE — Telephone Encounter (Signed)
Refill Request.  

## 2020-09-26 NOTE — Telephone Encounter (Signed)
This is Dr. Kem Parkinson patient. Please review for refill. Thank you!

## 2020-09-26 NOTE — Telephone Encounter (Signed)
This is a [facility] patient and she is overdue for an appointment.

## 2020-10-15 DIAGNOSIS — J208 Acute bronchitis due to other specified organisms: Secondary | ICD-10-CM | POA: Diagnosis not present

## 2020-10-15 DIAGNOSIS — B9689 Other specified bacterial agents as the cause of diseases classified elsewhere: Secondary | ICD-10-CM | POA: Diagnosis not present

## 2020-10-15 DIAGNOSIS — J019 Acute sinusitis, unspecified: Secondary | ICD-10-CM | POA: Diagnosis not present

## 2020-10-25 DIAGNOSIS — E785 Hyperlipidemia, unspecified: Secondary | ICD-10-CM | POA: Diagnosis not present

## 2020-10-25 DIAGNOSIS — K219 Gastro-esophageal reflux disease without esophagitis: Secondary | ICD-10-CM | POA: Diagnosis not present

## 2020-10-25 DIAGNOSIS — F411 Generalized anxiety disorder: Secondary | ICD-10-CM | POA: Diagnosis not present

## 2020-10-25 DIAGNOSIS — E559 Vitamin D deficiency, unspecified: Secondary | ICD-10-CM | POA: Diagnosis not present

## 2020-10-25 DIAGNOSIS — M8580 Other specified disorders of bone density and structure, unspecified site: Secondary | ICD-10-CM | POA: Diagnosis not present

## 2020-10-25 DIAGNOSIS — Z1231 Encounter for screening mammogram for malignant neoplasm of breast: Secondary | ICD-10-CM | POA: Diagnosis not present

## 2020-10-25 DIAGNOSIS — E039 Hypothyroidism, unspecified: Secondary | ICD-10-CM | POA: Diagnosis not present

## 2020-10-25 DIAGNOSIS — Z6828 Body mass index (BMI) 28.0-28.9, adult: Secondary | ICD-10-CM | POA: Diagnosis not present

## 2020-10-29 ENCOUNTER — Other Ambulatory Visit: Payer: Self-pay | Admitting: Internal Medicine

## 2020-10-29 DIAGNOSIS — Z1231 Encounter for screening mammogram for malignant neoplasm of breast: Secondary | ICD-10-CM

## 2020-11-02 DIAGNOSIS — I728 Aneurysm of other specified arteries: Secondary | ICD-10-CM | POA: Insufficient documentation

## 2020-11-02 DIAGNOSIS — I10 Essential (primary) hypertension: Secondary | ICD-10-CM | POA: Insufficient documentation

## 2020-11-02 DIAGNOSIS — E079 Disorder of thyroid, unspecified: Secondary | ICD-10-CM | POA: Insufficient documentation

## 2020-11-05 ENCOUNTER — Other Ambulatory Visit: Payer: Self-pay

## 2020-11-05 ENCOUNTER — Encounter: Payer: Self-pay | Admitting: Cardiology

## 2020-11-05 ENCOUNTER — Ambulatory Visit (INDEPENDENT_AMBULATORY_CARE_PROVIDER_SITE_OTHER): Payer: Medicare Other | Admitting: Cardiology

## 2020-11-05 VITALS — BP 146/80 | HR 68 | Ht 67.0 in | Wt 182.2 lb

## 2020-11-05 DIAGNOSIS — I491 Atrial premature depolarization: Secondary | ICD-10-CM

## 2020-11-05 DIAGNOSIS — R002 Palpitations: Secondary | ICD-10-CM | POA: Diagnosis not present

## 2020-11-05 MED ORDER — METOPROLOL SUCCINATE ER 25 MG PO TB24: 25.0000 mg | ORAL_TABLET | Freq: Every day | ORAL | 3 refills | Status: DC

## 2020-11-05 NOTE — Progress Notes (Signed)
Cardiology Office Note:    Date:  11/05/2020   ID:  Kristen Holden, DOB [birth year], MRN [id number]  PCP:  Lowella Dandy, NP  Cardiologist:  Jenean Lindau, MD   Referring MD: Lowella Dandy, NP    ASSESSMENT:    1. Palpitations   2. PAC (premature atrial contraction)    PLAN:    In order of problems listed above:  1. Primary prevention is to the patient.  Importance of compliance with diet medication stressed and she vocalized understanding.  I advised her to walk at least half an hour a day 5 days a week and she promises to do so. 2. Mixed dyslipidemia: Lipids from the past were reviewed and diet was emphasized.  Weight reduction stressed.  This is followed by primary care provider. 3. Palpitations: Stable and resolved on beta-blockers and will continue this medication. 4. Patient will be seen in follow-up appointment in 6 months or earlier if the patient has any concerns    Medication Adjustments/Labs and Tests Ordered: Current medicines are reviewed at length with the patient today.  Concerns regarding medicines are outlined above.  Orders Placed This Encounter  Procedures  . EKG 12-Lead   Meds ordered this encounter  Medications  . metoprolol succinate (TOPROL-XL) 25 MG 24 hr tablet    Sig: Take 1 tablet (25 mg total) by mouth daily.    Dispense:  90 tablet    Refill:  3     No chief complaint on file.    History of Present Illness:    Kristen Holden is a 75 y.o. female.  Patient has past medical history of PVCs and palpitations.  She denies any problems at this time and takes care of activities of daily living.  She leads a sedentary lifestyle.  With the beta-blocker her palpitations have subsided.  At the time of my evaluation, the patient is alert awake oriented and in no distress.  Past Medical History:  Diagnosis Date  . Hypertension   . PAC (premature atrial contraction) 11/23/2017  . Palpitations 11/23/2017  . Splenic artery aneurysm ([place])   . Thyroid  disease     Past Surgical History:  Procedure Laterality Date  . ABDOMINAL HYSTERECTOMY    . breast augumentation Bilateral   . TONSILLECTOMY      Current Medications: Current Meds  Medication Sig  . albuterol (VENTOLIN HFA) 108 (90 Base) MCG/ACT inhaler Inhale 2 puffs into the lungs every 4 (four) hours as needed.  Marland Kitchen aspirin 81 MG chewable tablet Chew 81 mg by mouth daily.  Marland Kitchen levothyroxine (SYNTHROID) 100 MCG tablet Take 100 mcg by mouth daily.  . montelukast (SINGULAIR) 10 MG tablet Take 10 mg by mouth at bedtime.  Marland Kitchen omeprazole (PRILOSEC) 20 MG capsule Take 20 mg by mouth daily.  . sertraline (ZOLOFT) 100 MG tablet Take 100 mg by mouth daily.  . vitamin B-12 (CYANOCOBALAMIN) 1000 MCG tablet Take 1,000 mcg by mouth daily.  . Vitamin D, Ergocalciferol, (DRISDOL) 50000 UNITS CAPS capsule Take 50,000 Units by mouth every 7 (seven) days.  . [DISCONTINUED] metoprolol succinate (TOPROL-XL) 25 MG 24 hr tablet Take 1 tablet (25 mg total) by mouth daily. OFFICE VISIT REQUIRED FOR FURTHER REFILLS.     Allergies:   Patient has no known allergies.   Social History   Socioeconomic History  . Marital status: Married    Spouse name: Not on file  . Number of children: Not on file  . Years of education: Not  on file  . Highest education level: Not on file  Occupational History  . Not on file  Tobacco Use  . Smoking status: Former Smoker    Quit date: 09/22/1996    Years since quitting: 24.1  . Smokeless tobacco: Never Used  Substance and Sexual Activity  . Alcohol use: Yes    Comment: 1 drink/ week  . Drug use: No  . Sexual activity: Not on file  Other Topics Concern  . Not on file  Social History Narrative  . Not on file   Social Determinants of Health   Financial Resource Strain: Not on file  Food Insecurity: Not on file  Transportation Needs: Not on file  Physical Activity: Not on file  Stress: Not on file  Social Connections: Not on file     Family History: The  patient's family history includes Cancer in her father; Diabetes in her mother; Hypertension in her father and mother; Varicose Veins in her mother.  ROS:   Please see the history of present illness.    All other systems reviewed and are negative.  EKGs/Labs/Other Studies Reviewed:    The following studies were reviewed today: I discussed my findings with the patient at length.   Recent Labs: No results found for requested labs within last 8760 hours.  Recent Lipid Panel    Component Value Date/Time   CHOL 236 (H) 08/03/2019 1511   TRIG 132 08/03/2019 1511   HDL 47 08/03/2019 1511   CHOLHDL 5.0 (H) 08/03/2019 1511   LDLCALC 165 (H) 08/03/2019 1511   LDLDIRECT 163 (H) 08/03/2019 1511    Physical Exam:    VS:  BP (!) 146/80   Pulse 68   Ht 5\' 7"  (1.702 m)   Wt 182 lb 3.2 oz (82.6 kg)   SpO2 98%   BMI 28.54 kg/m     Wt Readings from Last 3 Encounters:  11/05/20 182 lb 3.2 oz (82.6 kg)  08/03/19 177 lb (80.3 kg)  06/15/18 168 lb 9.6 oz (76.5 kg)     GEN: Patient is in no acute distress HEENT: Normal NECK: No JVD; No carotid bruits LYMPHATICS: No lymphadenopathy CARDIAC: Hear sounds regular, 2/6 systolic murmur at the apex. RESPIRATORY:  Clear to auscultation without rales, wheezing or rhonchi  ABDOMEN: Soft, non-tender, non-distended MUSCULOSKELETAL:  No edema; No deformity  SKIN: Warm and dry NEUROLOGIC:  Alert and oriented x 3 PSYCHIATRIC:  Normal affect   Signed, Jenean Lindau, MD  11/05/2020 12:00 PM    [place]

## 2020-11-05 NOTE — Patient Instructions (Signed)

## 2020-12-18 ENCOUNTER — Ambulatory Visit: Payer: Medicare Other

## 2021-02-13 ENCOUNTER — Ambulatory Visit: Payer: Medicare Other

## 2021-04-09 ENCOUNTER — Inpatient Hospital Stay: Admission: RE | Admit: 2021-04-09 | Payer: Medicare Other | Source: Ambulatory Visit

## 2021-04-16 ENCOUNTER — Ambulatory Visit
Admission: RE | Admit: 2021-04-16 | Discharge: 2021-04-16 | Disposition: A | Discharge status: Home / Self Care | Payer: Medicare Other | Source: Ambulatory Visit | Attending: Internal Medicine | Admitting: Internal Medicine

## 2021-04-16 ENCOUNTER — Other Ambulatory Visit: Payer: Self-pay

## 2021-04-16 DIAGNOSIS — Z1231 Encounter for screening mammogram for malignant neoplasm of breast: Secondary | ICD-10-CM | POA: Diagnosis not present

## 2021-05-03 DIAGNOSIS — E039 Hypothyroidism, unspecified: Secondary | ICD-10-CM | POA: Diagnosis not present

## 2021-05-03 DIAGNOSIS — Z6828 Body mass index (BMI) 28.0-28.9, adult: Secondary | ICD-10-CM | POA: Diagnosis not present

## 2021-05-03 DIAGNOSIS — I10 Essential (primary) hypertension: Secondary | ICD-10-CM | POA: Diagnosis not present

## 2021-05-03 DIAGNOSIS — F411 Generalized anxiety disorder: Secondary | ICD-10-CM | POA: Diagnosis not present

## 2021-05-03 DIAGNOSIS — M8580 Other specified disorders of bone density and structure, unspecified site: Secondary | ICD-10-CM | POA: Diagnosis not present

## 2021-05-03 DIAGNOSIS — R1310 Dysphagia, unspecified: Secondary | ICD-10-CM | POA: Diagnosis not present

## 2021-05-03 DIAGNOSIS — E785 Hyperlipidemia, unspecified: Secondary | ICD-10-CM | POA: Diagnosis not present

## 2021-05-03 DIAGNOSIS — E559 Vitamin D deficiency, unspecified: Secondary | ICD-10-CM | POA: Diagnosis not present

## 2021-05-03 DIAGNOSIS — K219 Gastro-esophageal reflux disease without esophagitis: Secondary | ICD-10-CM | POA: Diagnosis not present

## 2021-05-07 ENCOUNTER — Ambulatory Visit (INDEPENDENT_AMBULATORY_CARE_PROVIDER_SITE_OTHER): Payer: Medicare Other | Admitting: Cardiology

## 2021-05-07 ENCOUNTER — Other Ambulatory Visit: Payer: Self-pay

## 2021-05-07 ENCOUNTER — Encounter: Payer: Self-pay | Admitting: Cardiology

## 2021-05-07 VITALS — BP 128/86 | HR 86 | Ht 67.0 in | Wt 179.0 lb

## 2021-05-07 DIAGNOSIS — I491 Atrial premature depolarization: Secondary | ICD-10-CM

## 2021-05-07 DIAGNOSIS — R002 Palpitations: Secondary | ICD-10-CM | POA: Diagnosis not present

## 2021-05-07 DIAGNOSIS — E782 Mixed hyperlipidemia: Secondary | ICD-10-CM

## 2021-05-07 DIAGNOSIS — I10 Essential (primary) hypertension: Secondary | ICD-10-CM

## 2021-05-07 HISTORY — DX: Mixed hyperlipidemia: E78.2

## 2021-05-07 NOTE — Patient Instructions (Signed)
Medication Instructions:  No medication changes. *If you need a refill on your cardiac medications before your next appointment, please call your pharmacy*   Lab Work: None ordered If you have labs (blood work) drawn today and your tests are completely normal, you will receive your results only by: [place] (if you have MyChart) OR A paper copy in the mail If you have any lab test that is abnormal or we need to change your treatment, we will call you to review the results.   Testing/Procedures:  We will order CT coronary calcium score. It will cost $99.00 and is not covered by insurance.  Please call [phone] to schedule.   [facility]  [id number], [place] [address]    Follow-Up: At [facility], you and your health needs are our priority.  As part of our continuing mission to provide you with exceptional heart care, we have created designated Provider Care Teams.  These Care Teams include your primary Cardiologist (physician) and Advanced Practice Providers (APPs -  Physician Assistants and Nurse Practitioners) who all work together to provide you with the care you need, when you need it.  We recommend signing up for the patient portal called "MyChart".  Sign up information is provided on this After Visit Summary.  MyChart is used to connect with patients for Virtual Visits (Telemedicine).  Patients are able to view lab/test results, encounter notes, upcoming appointments, etc.  Non-urgent messages can be sent to your provider as well.   To learn more about what you can do with MyChart, go to [email or web address].    Your next appointment:   6 month(s)  The format for your next appointment:   In Person  Provider:   Jyl Heinz, MD   Other Instructions  Coronary Calcium Scan A coronary calcium scan is an imaging test used to look for deposits of plaque in the inner lining of the blood vessels of the heart (coronary arteries).  Plaque is made up of calcium, protein, and fatty substances. These deposits of plaque can partly clog and narrow the coronary arteries without producing any symptoms or warning signs. This puts a person at risk for a heart attack. This test is recommended for people who are at moderate risk for heart disease. The test can find plaque deposits before symptoms develop. Tell a health care provider about: Any allergies you have. All medicines you are taking, including vitamins, herbs, eye drops, creams, and over-the-counter medicines. Any problems you or family members have had with anesthetic medicines. Any blood disorders you have. Any surgeries you have had. Any medical conditions you have. Whether you are pregnant or may be pregnant. What are the risks? Generally, this is a safe procedure. However, problems may occur, including: Harm to a pregnant woman and her unborn baby. This test involves the use of radiation. Radiation exposure can be dangerous to a pregnant woman and her unborn baby. If you are pregnant or think you may be pregnant, you should not have this procedure done. Slight increase in the risk of cancer. This is because of the radiation involved in the test. What happens before the procedure? Ask your health care provider for any specific instructions on how to prepare for this procedure. You may be asked to avoid products that contain caffeine, tobacco, or nicotine for 4 hours before the procedure. What happens during the procedure? You will undress and remove any jewelry from your neck or chest. You will put on  a hospital gown. Sticky electrodes will be placed on your chest. The electrodes will be connected to an electrocardiogram (ECG) machine to record a tracing of the electrical activity of your heart. You will lie down on a curved bed that is attached to the [place]. You may be given medicine to slow down your heart rate so that clear pictures can be created. You will be  moved into the CT scanner, and the CT scanner will take pictures of your heart. During this time, you will be asked to lie still and hold your breath for 2-3 seconds at a time while each picture of your heart is being taken. The procedure may vary among health care providers and hospitals.    What happens after the procedure? You can get dressed. You can return to your normal activities. It is up to you to get the results of your procedure. Ask your health care provider, or the department that is doing the procedure, when your results will be ready. Summary A coronary calcium scan is an imaging test used to look for deposits of plaque in the inner lining of the blood vessels of the heart (coronary arteries). Plaque is made up of calcium, protein, and fatty substances. Generally, this is a safe procedure. Tell your health care provider if you are pregnant or may be pregnant. Ask your health care provider for any specific instructions on how to prepare for this procedure. A CT scanner will take pictures of your heart. You can return to your normal activities after the scan is done. This information is not intended to replace advice given to you by your health care provider. Make sure you discuss any questions you have with your health care provider. Document Revised: 03/29/2019 Document Reviewed: 03/29/2019 Elsevier Patient Education  [place].

## 2021-05-07 NOTE — Progress Notes (Signed)
Cardiology Office Note:    Date:  05/07/2021   ID:  [name], DOB [birth year], MRN [id number]  PCP:  Lowella Dandy, NP  Cardiologist:  Jenean Lindau, MD   Referring MD: Lowella Dandy, NP    ASSESSMENT:    1. Palpitations   2. Primary hypertension   3. PAC (premature atrial contraction)    PLAN:    In order of problems listed above:  Primary prevention stressed with the patient.  Importance of compliance with diet medication stressed and she vocalized understanding.  She was advised to walk at least half an hour a day 5 days a week and she promises to do better. Palpitations: These have resolved and she is happy with it.  She is on a beta-blocker. Mixed dyslipidemia: Diet was emphasized.  Lifestyle modification urged.  I discussed lipid-lowering therapy.  She would like to get a calcium score to help this management.  Meanwhile she will diet and exercise regularly. Essential hypertension: Blood pressure stable and diet was emphasized. Patient will be seen in follow-up appointment in 6 months or earlier if the patient has any concerns..  Copy of all lab work from primary care.   Medication Adjustments/Labs and Tests Ordered: Current medicines are reviewed at length with the patient today.  Concerns regarding medicines are outlined above.  No orders of the defined types were placed in this encounter.  No orders of the defined types were placed in this encounter.    Chief Complaint  Patient presents with   Follow-up     History of Present Illness:    Kristen Holden is a 75 y.o. female.  Patient has past medical history of essential hypertension and palpitations.  She denies any problems at this time and takes care of activities of daily living.  No chest pain orthopnea or PND.  I noted on the KPN sheet that lipids are markedly elevated.  She leads a sedentary lifestyle.  At the time of my evaluation, the patient is alert awake oriented and in no distress.  Past  Medical History:  Diagnosis Date   Hypertension    PAC (premature atrial contraction) 11/23/2017   Palpitations 11/23/2017   Splenic artery aneurysm (HCC)    Thyroid disease     Past Surgical History:  Procedure Laterality Date   ABDOMINAL HYSTERECTOMY     AUGMENTATION MAMMAPLASTY     breast augumentation Bilateral    TONSILLECTOMY      Current Medications: Current Meds  Medication Sig   albuterol (VENTOLIN HFA) 108 (90 Base) MCG/ACT inhaler Inhale 2 puffs into the lungs every 4 (four) hours as needed for wheezing or shortness of breath.   aspirin 81 MG chewable tablet Chew 81 mg by mouth daily.   levothyroxine (SYNTHROID) 100 MCG tablet Take 100 mcg by mouth daily.   metoprolol succinate (TOPROL-XL) 25 MG 24 hr tablet Take 1 tablet (25 mg total) by mouth daily.   montelukast (SINGULAIR) 10 MG tablet Take 10 mg by mouth at bedtime.   omeprazole (PRILOSEC) 20 MG capsule Take 20 mg by mouth daily.   sertraline (ZOLOFT) 100 MG tablet Take 100 mg by mouth daily.   Vitamin D, Ergocalciferol, (DRISDOL) 50000 UNITS CAPS capsule Take 50,000 Units by mouth every 7 (seven) days.     Allergies:   Patient has no known allergies.   Social History   Socioeconomic History   Marital status: Married    Spouse name: Not on file   Number of  children: Not on file   Years of education: Not on file   Highest education level: Not on file  Occupational History   Not on file  Tobacco Use   Smoking status: Former    Types: Cigarettes    Quit date: 09/22/1996    Years since quitting: 24.6   Smokeless tobacco: Never  Substance and Sexual Activity   Alcohol use: Yes    Comment: 1 drink/ week   Drug use: No   Sexual activity: Not on file  Other Topics Concern   Not on file  Social History Narrative   Not on file   Social Determinants of Health   Financial Resource Strain: Not on file  Food Insecurity: Not on file  Transportation Needs: Not on file  Physical Activity: Not on file  Stress:  Not on file  Social Connections: Not on file     Family History: The patient's family history includes Cancer in her father; Diabetes in her mother; Hypertension in her father and mother; Varicose Veins in her mother.  ROS:   Please see the history of present illness.    All other systems reviewed and are negative.  EKGs/Labs/Other Studies Reviewed:    The following studies were reviewed today: I discussed my findings with the patient at length.   Recent Labs: No results found for requested labs within last 8760 hours.  Recent Lipid Panel    Component Value Date/Time   CHOL 236 (H) 08/03/2019 1511   TRIG 132 08/03/2019 1511   HDL 47 08/03/2019 1511   CHOLHDL 5.0 (H) 08/03/2019 1511   LDLCALC 165 (H) 08/03/2019 1511   LDLDIRECT 163 (H) 08/03/2019 1511    Physical Exam:    VS:  BP 128/86 (BP Location: Left Arm, Patient Position: Sitting)   Pulse 86   Ht '5\' 7"'$  (1.702 m)   Wt 179 lb (81.2 kg)   SpO2 94%   BMI 28.04 kg/m     Wt Readings from Last 3 Encounters:  05/07/21 179 lb (81.2 kg)  11/05/20 182 lb 3.2 oz (82.6 kg)  08/03/19 177 lb (80.3 kg)     GEN: Patient is in no acute distress HEENT: Normal NECK: No JVD; No carotid bruits LYMPHATICS: No lymphadenopathy CARDIAC: Hear sounds regular, 2/6 systolic murmur at the apex. RESPIRATORY:  Clear to auscultation without rales, wheezing or rhonchi  ABDOMEN: Soft, non-tender, non-distended MUSCULOSKELETAL:  No edema; No deformity  SKIN: Warm and dry NEUROLOGIC:  Alert and oriented x 3 PSYCHIATRIC:  Normal affect   Signed, Jenean Lindau, MD  05/07/2021 10:25 AM    [place]

## 2021-05-10 ENCOUNTER — Telehealth: Payer: Self-pay | Admitting: Cardiology

## 2021-05-10 NOTE — Telephone Encounter (Signed)
Patient wanted to know if Dr. Geraldo Pitter got her lab results from her PCP.

## 2021-05-13 NOTE — Telephone Encounter (Signed)
Spoke with pt and advised that we had not received her labs from her PCP and that the numbers he was discussing with her was from her [facility]. Pt verbalized understanding and had no questions.

## 2021-05-24 DIAGNOSIS — M9903 Segmental and somatic dysfunction of lumbar region: Secondary | ICD-10-CM | POA: Diagnosis not present

## 2021-05-24 DIAGNOSIS — M9905 Segmental and somatic dysfunction of pelvic region: Secondary | ICD-10-CM | POA: Diagnosis not present

## 2021-05-24 DIAGNOSIS — M461 Sacroiliitis, not elsewhere classified: Secondary | ICD-10-CM | POA: Diagnosis not present

## 2021-05-24 DIAGNOSIS — M5451 Vertebrogenic low back pain: Secondary | ICD-10-CM | POA: Diagnosis not present

## 2021-05-24 DIAGNOSIS — M9902 Segmental and somatic dysfunction of thoracic region: Secondary | ICD-10-CM | POA: Diagnosis not present

## 2021-06-11 DIAGNOSIS — E785 Hyperlipidemia, unspecified: Secondary | ICD-10-CM | POA: Diagnosis not present

## 2021-06-11 DIAGNOSIS — Z0000 Encounter for general adult medical examination without abnormal findings: Secondary | ICD-10-CM | POA: Diagnosis not present

## 2021-06-11 DIAGNOSIS — Z139 Encounter for screening, unspecified: Secondary | ICD-10-CM | POA: Diagnosis not present

## 2021-06-11 DIAGNOSIS — Z9181 History of falling: Secondary | ICD-10-CM | POA: Diagnosis not present

## 2021-06-11 DIAGNOSIS — Z1331 Encounter for screening for depression: Secondary | ICD-10-CM | POA: Diagnosis not present

## 2021-08-13 DIAGNOSIS — C44519 Basal cell carcinoma of skin of other part of trunk: Secondary | ICD-10-CM | POA: Diagnosis not present

## 2021-08-13 DIAGNOSIS — L814 Other melanin hyperpigmentation: Secondary | ICD-10-CM | POA: Diagnosis not present

## 2021-08-13 DIAGNOSIS — Z419 Encounter for procedure for purposes other than remedying health state, unspecified: Secondary | ICD-10-CM | POA: Diagnosis not present

## 2021-08-13 DIAGNOSIS — D229 Melanocytic nevi, unspecified: Secondary | ICD-10-CM | POA: Diagnosis not present

## 2021-08-13 DIAGNOSIS — L905 Scar conditions and fibrosis of skin: Secondary | ICD-10-CM | POA: Diagnosis not present

## 2021-08-13 DIAGNOSIS — L821 Other seborrheic keratosis: Secondary | ICD-10-CM | POA: Diagnosis not present

## 2021-08-13 DIAGNOSIS — D485 Neoplasm of uncertain behavior of skin: Secondary | ICD-10-CM | POA: Diagnosis not present

## 2021-08-13 DIAGNOSIS — Z08 Encounter for follow-up examination after completed treatment for malignant neoplasm: Secondary | ICD-10-CM | POA: Diagnosis not present

## 2021-08-13 DIAGNOSIS — L578 Other skin changes due to chronic exposure to nonionizing radiation: Secondary | ICD-10-CM | POA: Diagnosis not present

## 2021-08-13 DIAGNOSIS — Z85828 Personal history of other malignant neoplasm of skin: Secondary | ICD-10-CM | POA: Diagnosis not present

## 2021-08-28 DIAGNOSIS — H43393 Other vitreous opacities, bilateral: Secondary | ICD-10-CM | POA: Diagnosis not present

## 2021-08-28 DIAGNOSIS — Z961 Presence of intraocular lens: Secondary | ICD-10-CM | POA: Diagnosis not present

## 2021-08-28 DIAGNOSIS — H353131 Nonexudative age-related macular degeneration, bilateral, early dry stage: Secondary | ICD-10-CM | POA: Diagnosis not present

## 2021-08-28 DIAGNOSIS — H524 Presbyopia: Secondary | ICD-10-CM | POA: Diagnosis not present

## 2021-09-04 DIAGNOSIS — Z23 Encounter for immunization: Secondary | ICD-10-CM | POA: Diagnosis not present

## 2021-10-25 DIAGNOSIS — L905 Scar conditions and fibrosis of skin: Secondary | ICD-10-CM | POA: Diagnosis not present

## 2021-10-25 DIAGNOSIS — C44519 Basal cell carcinoma of skin of other part of trunk: Secondary | ICD-10-CM | POA: Diagnosis not present

## 2021-11-25 ENCOUNTER — Ambulatory Visit: Payer: Medicare Other | Admitting: Cardiology

## 2021-12-23 DIAGNOSIS — H6121 Impacted cerumen, right ear: Secondary | ICD-10-CM | POA: Diagnosis not present

## 2021-12-23 DIAGNOSIS — J208 Acute bronchitis due to other specified organisms: Secondary | ICD-10-CM | POA: Diagnosis not present

## 2021-12-23 DIAGNOSIS — B9689 Other specified bacterial agents as the cause of diseases classified elsewhere: Secondary | ICD-10-CM | POA: Diagnosis not present

## 2021-12-23 DIAGNOSIS — J019 Acute sinusitis, unspecified: Secondary | ICD-10-CM | POA: Diagnosis not present

## 2022-01-08 ENCOUNTER — Other Ambulatory Visit: Payer: Self-pay | Admitting: Cardiology

## 2022-01-24 ENCOUNTER — Ambulatory Visit: Payer: Medicare Other | Admitting: Cardiology

## 2022-01-24 DIAGNOSIS — K219 Gastro-esophageal reflux disease without esophagitis: Secondary | ICD-10-CM | POA: Diagnosis not present

## 2022-01-24 DIAGNOSIS — E559 Vitamin D deficiency, unspecified: Secondary | ICD-10-CM | POA: Diagnosis not present

## 2022-01-24 DIAGNOSIS — J309 Allergic rhinitis, unspecified: Secondary | ICD-10-CM | POA: Diagnosis not present

## 2022-01-24 DIAGNOSIS — Z79899 Other long term (current) drug therapy: Secondary | ICD-10-CM | POA: Diagnosis not present

## 2022-01-24 DIAGNOSIS — E785 Hyperlipidemia, unspecified: Secondary | ICD-10-CM | POA: Diagnosis not present

## 2022-01-24 DIAGNOSIS — I10 Essential (primary) hypertension: Secondary | ICD-10-CM | POA: Diagnosis not present

## 2022-01-24 DIAGNOSIS — F411 Generalized anxiety disorder: Secondary | ICD-10-CM | POA: Diagnosis not present

## 2022-01-24 DIAGNOSIS — E039 Hypothyroidism, unspecified: Secondary | ICD-10-CM | POA: Diagnosis not present

## 2022-01-27 ENCOUNTER — Encounter: Payer: Self-pay | Admitting: Cardiology

## 2022-01-27 ENCOUNTER — Ambulatory Visit (INDEPENDENT_AMBULATORY_CARE_PROVIDER_SITE_OTHER): Payer: Medicare Other | Admitting: Cardiology

## 2022-01-27 VITALS — BP 126/76 | HR 70 | Ht 67.0 in | Wt 179.6 lb

## 2022-01-27 DIAGNOSIS — I10 Essential (primary) hypertension: Secondary | ICD-10-CM

## 2022-01-27 DIAGNOSIS — E875 Hyperkalemia: Secondary | ICD-10-CM | POA: Diagnosis not present

## 2022-01-27 DIAGNOSIS — R002 Palpitations: Secondary | ICD-10-CM | POA: Diagnosis not present

## 2022-01-27 DIAGNOSIS — E782 Mixed hyperlipidemia: Secondary | ICD-10-CM | POA: Diagnosis not present

## 2022-01-27 DIAGNOSIS — I491 Atrial premature depolarization: Secondary | ICD-10-CM

## 2022-01-27 NOTE — Progress Notes (Signed)
?Cardiology Office Note:   ? ?Date:  01/27/2022  ? ?ID:  Kristen Holden, DOB [birth year], MRN [id number] ? ?PCP:  Lowella Dandy, NP  ?Cardiologist:  Jenean Lindau, MD  ? ?Referring MD: Lowella Dandy, NP  ? ? ?ASSESSMENT:   ? ?1. Palpitations   ?2. Mixed dyslipidemia   ?3. PAC (premature atrial contraction)   ?4. Primary hypertension   ? ?PLAN:   ? ?In order of problems listed above: ? ?Primary prevention stressed with the patient.  Importance of compliance with diet medication stressed and she vocalized understanding.  She was advised to consistently walk at least 5 days a week half an hour a day and she promises to do so.  Weight reduction was stressed. ?Essential hypertension and PACs: Stable at this time and asymptomatic. ?Mixed dyslipidemia: Diet was emphasized.  Lifestyle modification was urged and she promises to do better.  For risk stratification I reviewed calcium scoring and she is willing to get it done.  We will make sure that this is scheduled. ?Patient will be seen in follow-up appointment in 12 months or earlier if the patient has any concerns ? ? ? ?Medication Adjustments/Labs and Tests Ordered: ?Current medicines are reviewed at length with the patient today.  Concerns regarding medicines are outlined above.  ?No orders of the defined types were placed in this encounter. ? ?No orders of the defined types were placed in this encounter. ? ? ? ?No chief complaint on file. ?  ? ?History of Present Illness:   ? ?Kristen Holden is a 76 y.o. female.  Patient has past medical history of essential hypertension, mixed dyslipidemia and palpitations.  Most of her issues have resolved.  She is on a low-dose beta-blocker for PACs and essential hypertension.  These has resolved her symptoms.  Her lipids are elevated.  She was told about calcium scoring but she did not get that done and she is keen on doing it.  At the time of my evaluation, the patient is alert awake oriented and in no distress. ? ?Past Medical  History:  ?Diagnosis Date  ? Hypertension   ? Mixed dyslipidemia 05/07/2021  ? PAC (premature atrial contraction) 11/23/2017  ? Palpitations 11/23/2017  ? Splenic artery aneurysm ([place])   ? Thyroid disease   ? ? ?Past Surgical History:  ?Procedure Laterality Date  ? ABDOMINAL HYSTERECTOMY    ? AUGMENTATION MAMMAPLASTY    ? breast augumentation Bilateral   ? TONSILLECTOMY    ? ? ?Current Medications: ?Current Meds  ?Medication Sig  ? aspirin 81 MG chewable tablet Chew 81 mg by mouth daily.  ? levothyroxine (SYNTHROID) 100 MCG tablet Take 100 mcg by mouth daily.  ? metoprolol succinate (TOPROL-XL) 25 MG 24 hr tablet Take 1 tablet (25 mg total) by mouth daily.  ? montelukast (SINGULAIR) 10 MG tablet Take 10 mg by mouth at bedtime.  ? omeprazole (PRILOSEC) 20 MG capsule Take 20 mg by mouth daily.  ? sertraline (ZOLOFT) 100 MG tablet Take 100 mg by mouth daily.  ? Vitamin D, Ergocalciferol, (DRISDOL) 50000 UNITS CAPS capsule Take 50,000 Units by mouth every 7 (seven) days.  ?  ? ?Allergies:   Patient has no known allergies.  ? ?Social History  ? ?Socioeconomic History  ? Marital status: Married  ?  Spouse name: Not on file  ? Number of children: Not on file  ? Years of education: Not on file  ? Highest education level: Not on file  ?Occupational  History  ? Not on file  ?Tobacco Use  ? Smoking status: Former  ?  Types: Cigarettes  ?  Quit date: 09/22/1996  ?  Years since quitting: 25.3  ? Smokeless tobacco: Never  ?Substance and Sexual Activity  ? Alcohol use: Yes  ?  Comment: 1 drink/ week  ? Drug use: No  ? Sexual activity: Not on file  ?Other Topics Concern  ? Not on file  ?Social History Narrative  ? Not on file  ? ?Social Determinants of Health  ? ?Financial Resource Strain: Not on file  ?Food Insecurity: Not on file  ?Transportation Needs: Not on file  ?Physical Activity: Not on file  ?Stress: Not on file  ?Social Connections: Not on file  ?  ? ?Family History: ?The patient's family history includes Cancer in her father;  Diabetes in her mother; Hypertension in her father and mother; Varicose Veins in her mother. ? ?ROS:   ?Please see the history of present illness.    ?All other systems reviewed and are negative. ? ?EKGs/Labs/Other Studies Reviewed:   ? ?The following studies were reviewed today: ?I discussed my findings with the patient at length. ? ? ?Recent Labs: ?No results found for requested labs within last 8760 hours.  ?Recent Lipid Panel ?   ?Component Value Date/Time  ? CHOL 236 (H) 08/03/2019 1511  ? TRIG 132 08/03/2019 1511  ? HDL 47 08/03/2019 1511  ? CHOLHDL 5.0 (H) 08/03/2019 1511  ? LDLCALC 165 (H) 08/03/2019 1511  ? LDLDIRECT 163 (H) 08/03/2019 1511  ? ? ?Physical Exam:   ? ?VS:  BP 126/76 (BP Location: Left Arm, Patient Position: Sitting)   Pulse 70   Ht '5\' 7"'$  (1.702 m)   Wt 179 lb 9.6 oz (81.5 kg)   SpO2 96%   BMI 28.13 kg/m?    ? ?Wt Readings from Last 3 Encounters:  ?01/27/22 179 lb 9.6 oz (81.5 kg)  ?05/07/21 179 lb (81.2 kg)  ?11/05/20 182 lb 3.2 oz (82.6 kg)  ?  ? ?GEN: Patient is in no acute distress ?HEENT: Normal ?NECK: No JVD; No carotid bruits ?LYMPHATICS: No lymphadenopathy ?CARDIAC: Hear sounds regular, 2/6 systolic murmur at the apex. ?RESPIRATORY:  Clear to auscultation without rales, wheezing or rhonchi  ?ABDOMEN: Soft, non-tender, non-distended ?MUSCULOSKELETAL:  No edema; No deformity  ?SKIN: Warm and dry ?NEUROLOGIC:  Alert and oriented x 3 ?PSYCHIATRIC:  Normal affect  ? ?Signed, ?Jenean Lindau, MD  ?01/27/2022 1:23 PM    ?[place]  ?

## 2022-01-27 NOTE — Patient Instructions (Addendum)
Medication Instructions:  ?Your physician recommends that you continue on your current medications as directed. Please refer to the Current Medication list given to you today.  ?*If you need a refill on your cardiac medications before your next appointment, please call your pharmacy* ? ? ?Lab Work: ?None ordered ?If you have labs (blood work) drawn today and your tests are completely normal, you will receive your results only by: ?MyChart Message (if you have MyChart) OR ?A paper copy in the mail ?If you have any lab test that is abnormal or we need to change your treatment, we will call you to review the results. ? ? ?Testing/Procedures: ? ?We will order CT coronary calcium score. ?It will cost $99.00 and is not covered by insurance.  ?Please call to schedule.   ? ?[facility]  ?[address]  ?[place], [place] [address] ?[phone] ?           Or ?[place] [address],  [address] ?[phone] ? ? ? ?Follow-Up: ?At [facility], you and your health needs are our priority.  As part of our continuing mission to provide you with exceptional heart care, we have created designated Provider Care Teams.  These Care Teams include your primary Cardiologist (physician) and Advanced Practice Providers (APPs -  Physician Assistants and Nurse Practitioners) who all work together to provide you with the care you need, when you need it. ? ?We recommend signing up for the patient portal called "MyChart".  Sign up information is provided on this After Visit Summary.  MyChart is used to connect with patients for Virtual Visits (Telemedicine).  Patients are able to view lab/test results, encounter notes, upcoming appointments, etc.  Non-urgent messages can be sent to your provider as well.   ?To learn more about what you can do with MyChart, go to [email or web address].   ? ?Your next appointment:   ?12 month(s) ? ?The format for your next appointment:   ?In Person ? ?Provider:    ?Jyl Heinz, MD ? ? ?Other Instructions ?NA ? ?

## 2022-03-17 ENCOUNTER — Ambulatory Visit (INDEPENDENT_AMBULATORY_CARE_PROVIDER_SITE_OTHER)
Admission: RE | Admit: 2022-03-17 | Discharge: 2022-03-17 | Disposition: A | Discharge status: Home / Self Care | Payer: Self-pay | Source: Ambulatory Visit | Attending: Cardiology | Admitting: Cardiology

## 2022-03-17 DIAGNOSIS — I10 Essential (primary) hypertension: Secondary | ICD-10-CM

## 2022-03-17 DIAGNOSIS — I491 Atrial premature depolarization: Secondary | ICD-10-CM

## 2022-03-17 DIAGNOSIS — E782 Mixed hyperlipidemia: Secondary | ICD-10-CM

## 2022-03-18 ENCOUNTER — Telehealth: Payer: Self-pay

## 2022-03-18 DIAGNOSIS — E782 Mixed hyperlipidemia: Secondary | ICD-10-CM

## 2022-03-18 DIAGNOSIS — R931 Abnormal findings on diagnostic imaging of heart and coronary circulation: Secondary | ICD-10-CM

## 2022-04-06 ENCOUNTER — Other Ambulatory Visit: Payer: Self-pay | Admitting: Cardiology

## 2022-08-01 ENCOUNTER — Other Ambulatory Visit: Payer: Self-pay | Admitting: Internal Medicine

## 2022-08-01 DIAGNOSIS — Z9181 History of falling: Secondary | ICD-10-CM | POA: Diagnosis not present

## 2022-08-01 DIAGNOSIS — Z79899 Other long term (current) drug therapy: Secondary | ICD-10-CM | POA: Diagnosis not present

## 2022-08-01 DIAGNOSIS — M8589 Other specified disorders of bone density and structure, multiple sites: Secondary | ICD-10-CM | POA: Diagnosis not present

## 2022-08-01 DIAGNOSIS — E559 Vitamin D deficiency, unspecified: Secondary | ICD-10-CM | POA: Diagnosis not present

## 2022-08-01 DIAGNOSIS — E785 Hyperlipidemia, unspecified: Secondary | ICD-10-CM | POA: Diagnosis not present

## 2022-08-01 DIAGNOSIS — K219 Gastro-esophageal reflux disease without esophagitis: Secondary | ICD-10-CM | POA: Diagnosis not present

## 2022-08-01 DIAGNOSIS — F411 Generalized anxiety disorder: Secondary | ICD-10-CM | POA: Diagnosis not present

## 2022-08-01 DIAGNOSIS — Z1231 Encounter for screening mammogram for malignant neoplasm of breast: Secondary | ICD-10-CM

## 2022-08-01 DIAGNOSIS — I10 Essential (primary) hypertension: Secondary | ICD-10-CM | POA: Diagnosis not present

## 2022-08-01 DIAGNOSIS — E039 Hypothyroidism, unspecified: Secondary | ICD-10-CM | POA: Diagnosis not present

## 2022-08-01 DIAGNOSIS — J309 Allergic rhinitis, unspecified: Secondary | ICD-10-CM | POA: Diagnosis not present

## 2022-10-02 ENCOUNTER — Ambulatory Visit: Payer: Medicare Other

## 2022-10-03 ENCOUNTER — Ambulatory Visit: Payer: Medicare Other

## 2022-11-21 ENCOUNTER — Ambulatory Visit
Admission: RE | Admit: 2022-11-21 | Discharge: 2022-11-21 | Disposition: A | Discharge status: Home / Self Care | Payer: Medicare Other | Source: Ambulatory Visit | Attending: Internal Medicine | Admitting: Internal Medicine

## 2022-11-21 DIAGNOSIS — Z1231 Encounter for screening mammogram for malignant neoplasm of breast: Secondary | ICD-10-CM

## 2022-12-08 DIAGNOSIS — M79605 Pain in left leg: Secondary | ICD-10-CM | POA: Diagnosis not present

## 2022-12-08 DIAGNOSIS — M898X6 Other specified disorders of bone, lower leg: Secondary | ICD-10-CM | POA: Diagnosis not present

## 2023-01-21 ENCOUNTER — Other Ambulatory Visit: Payer: Self-pay | Admitting: Internal Medicine

## 2023-01-21 DIAGNOSIS — M8589 Other specified disorders of bone density and structure, multiple sites: Secondary | ICD-10-CM

## 2023-01-23 ENCOUNTER — Inpatient Hospital Stay: Admission: RE | Admit: 2023-01-23 | Payer: Medicare Other | Source: Ambulatory Visit

## 2023-01-24 ENCOUNTER — Other Ambulatory Visit: Payer: Self-pay | Admitting: Cardiology

## 2023-02-13 DIAGNOSIS — K219 Gastro-esophageal reflux disease without esophagitis: Secondary | ICD-10-CM | POA: Diagnosis not present

## 2023-02-13 DIAGNOSIS — I700 Atherosclerosis of aorta: Secondary | ICD-10-CM | POA: Diagnosis not present

## 2023-02-13 DIAGNOSIS — F411 Generalized anxiety disorder: Secondary | ICD-10-CM | POA: Diagnosis not present

## 2023-02-13 DIAGNOSIS — I10 Essential (primary) hypertension: Secondary | ICD-10-CM | POA: Diagnosis not present

## 2023-02-13 DIAGNOSIS — E785 Hyperlipidemia, unspecified: Secondary | ICD-10-CM | POA: Diagnosis not present

## 2023-02-13 DIAGNOSIS — E039 Hypothyroidism, unspecified: Secondary | ICD-10-CM | POA: Diagnosis not present

## 2023-02-13 DIAGNOSIS — Z79899 Other long term (current) drug therapy: Secondary | ICD-10-CM | POA: Diagnosis not present

## 2023-02-13 DIAGNOSIS — Z139 Encounter for screening, unspecified: Secondary | ICD-10-CM | POA: Diagnosis not present

## 2023-02-13 DIAGNOSIS — E559 Vitamin D deficiency, unspecified: Secondary | ICD-10-CM | POA: Diagnosis not present

## 2023-02-20 ENCOUNTER — Other Ambulatory Visit: Payer: Self-pay | Admitting: Cardiology

## 2023-02-24 ENCOUNTER — Other Ambulatory Visit: Payer: Self-pay | Admitting: Cardiology

## 2023-03-05 ENCOUNTER — Other Ambulatory Visit: Payer: Self-pay | Admitting: Cardiology

## 2023-03-21 ENCOUNTER — Other Ambulatory Visit: Payer: Self-pay | Admitting: Cardiology

## 2023-04-07 ENCOUNTER — Other Ambulatory Visit: Payer: Self-pay

## 2023-04-08 ENCOUNTER — Encounter: Payer: Self-pay | Admitting: Cardiology

## 2023-04-08 ENCOUNTER — Ambulatory Visit: Payer: Medicare Other | Attending: Cardiology | Admitting: Cardiology

## 2023-04-08 VITALS — BP 132/78 | HR 69 | Ht 67.0 in | Wt 176.4 lb

## 2023-04-08 DIAGNOSIS — E039 Hypothyroidism, unspecified: Secondary | ICD-10-CM | POA: Diagnosis not present

## 2023-04-08 DIAGNOSIS — R931 Abnormal findings on diagnostic imaging of heart and coronary circulation: Secondary | ICD-10-CM | POA: Diagnosis not present

## 2023-04-08 DIAGNOSIS — I10 Essential (primary) hypertension: Secondary | ICD-10-CM | POA: Diagnosis present

## 2023-04-08 DIAGNOSIS — E782 Mixed hyperlipidemia: Secondary | ICD-10-CM | POA: Diagnosis not present

## 2023-04-08 DIAGNOSIS — I491 Atrial premature depolarization: Secondary | ICD-10-CM | POA: Diagnosis present

## 2023-04-08 DIAGNOSIS — R002 Palpitations: Secondary | ICD-10-CM | POA: Insufficient documentation

## 2023-04-08 NOTE — Patient Instructions (Addendum)
Medication Instructions:  Your physician has recommended you make the following change in your medication:   If your labs are ok we will start Rosuvastatin 10 mg daily.   *If you need a refill on your cardiac medications before your next appointment, please call your pharmacy*   Lab Work: Your physician recommends that you have a CMP today in the office.  Your physician recommends that you return for lab work in: 6 weeks for fasting lipids and CMP. You can come Monday through Friday 8:30 am to 12:00 pm and 1:15 to 4:30. You do not need to make an appointment as the order has already been placed.    If you have labs (blood work) drawn today and your tests are completely normal, you will receive your results only by: MyChart Message (if you have MyChart) OR A paper copy in the mail If you have any lab test that is abnormal or we need to change your treatment, we will call you to review the results.   Testing/Procedures: None ordered   Follow-Up: At [facility], you and your health needs are our priority.  As part of our continuing mission to provide you with exceptional heart care, we have created designated Provider Care Teams.  These Care Teams include your primary Cardiologist (physician) and Advanced Practice Providers (APPs -  Physician Assistants and Nurse Practitioners) who all work together to provide you with the care you need, when you need it.  We recommend signing up for the patient portal called "MyChart".  Sign up information is provided on this After Visit Summary.  MyChart is used to connect with patients for Virtual Visits (Telemedicine).  Patients are able to view lab/test results, encounter notes, upcoming appointments, etc.  Non-urgent messages can be sent to your provider as well.   To learn more about what you can do with MyChart, go to [email or web address].    Your next appointment:   6 month(s)  The format for your next appointment:   In  Person  Provider:   Belva Crome, MD    Other Instructions none  Important Information About Sugar

## 2023-04-08 NOTE — Addendum Note (Signed)
Addended by: Eleonore Chiquito on: 04/08/2023 04:11 PM   Modules accepted: Orders

## 2023-04-08 NOTE — Progress Notes (Signed)
Cardiology Office Note:    Date:  04/08/2023   ID:  Kristen Holden, DOB [birth year], MRN [id number]  PCP:  Kristen Party, NP  Cardiologist:  Garwin Brothers, MD   Referring MD: Kristen Party, NP    ASSESSMENT:    1. PAC (premature atrial contraction)   2. Primary hypertension   3. Mixed dyslipidemia   4. Elevated coronary artery calcium score   5. Palpitations    PLAN:    In order of problems listed above:  Elevated calcium score: Secondary prevention stressed with patient.  Importance of compliance with diet medication stressed and she vocalized understanding.  She was advised to walk at least half an hour a day 5 days a week and she promises to do so. Essential hypertension: Blood pressure stable and diet was emphasized. Mixed dyslipidemia: I discussed lipid-lowering with statins he is agreeable.  Will do baseline blood work and start statin therapy today.  She will be back in 6 weeks for liver lipid check diet and excise stress. Aortic atherosclerosis: I discussed this with her at length. Weight reduction stressed Patient will be seen in follow-up appointment in 6 months or earlier if the patient has any concerns.    Medication Adjustments/Labs and Tests Ordered: Current medicines are reviewed at length with the patient today.  Concerns regarding medicines are outlined above.  Orders Placed This Encounter  Procedures   EKG 12-Lead   No orders of the defined types were placed in this encounter.    No chief complaint on file.    History of Present Illness:    Kristen Holden is a 77 y.o. female.  Patient has past medical history of elevated calcium score, mixed dyslipidemia essential hypertension.  She leads a sedentary lifestyle.  She denies any chest pain orthopnea or PND.  At the time of my evaluation, the patient is alert awake oriented and in no distress.  She is here for a routine appointment  Past Medical History:  Diagnosis Date   Hypertension    Mixed  dyslipidemia 05/07/2021   PAC (premature atrial contraction) 11/23/2017   Palpitations 11/23/2017   Splenic artery aneurysm (HCC)    Thyroid disease     Past Surgical History:  Procedure Laterality Date   ABDOMINAL HYSTERECTOMY     AUGMENTATION MAMMAPLASTY     breast augumentation Bilateral    TONSILLECTOMY      Current Medications: Current Meds  Medication Sig   aspirin 81 MG chewable tablet Chew 81 mg by mouth daily.   levothyroxine (SYNTHROID) 112 MCG tablet Take 112 mcg by mouth daily.   metoprolol succinate (TOPROL-XL) 25 MG 24 hr tablet Take 25 mg by mouth daily.   montelukast (SINGULAIR) 10 MG tablet Take 10 mg by mouth at bedtime.   omeprazole (PRILOSEC) 20 MG capsule Take 20 mg by mouth daily.   sertraline (ZOLOFT) 100 MG tablet Take 100 mg by mouth daily.   Vitamin D, Ergocalciferol, (DRISDOL) 50000 UNITS CAPS capsule Take 50,000 Units by mouth every 7 (seven) days.     Allergies:   Heparin and Nickel   Social History   Socioeconomic History   Marital status: Married    Spouse name: Not on file   Number of children: Not on file   Years of education: Not on file   Highest education level: Not on file  Occupational History   Not on file  Tobacco Use   Smoking status: Former    Current packs/day: 0.00  Types: Cigarettes    Quit date: 09/22/1996    Years since quitting: 26.5   Smokeless tobacco: Never  Substance and Sexual Activity   Alcohol use: Yes    Comment: 1 drink/ week   Drug use: No   Sexual activity: Not on file  Other Topics Concern   Not on file  Social History Narrative   Not on file   Social Determinants of Health   Financial Resource Strain: Not on file  Food Insecurity: Not on file  Transportation Needs: Not on file  Physical Activity: Not on file  Stress: Not on file  Social Connections: Not on file     Family History: The patient's family history includes Cancer in her father; Diabetes in her mother; Hypertension in her father and  mother; Varicose Veins in her mother.  ROS:   Please see the history of present illness.    All other systems reviewed and are negative.  EKGs/Labs/Other Studies Reviewed:    The following studies were reviewed today: EKG reveals sinus rhythm PACs bigeminy and poor anterior forces.   Recent Labs: No results found for requested labs within last 365 days.  Recent Lipid Panel    Component Value Date/Time   CHOL 236 (H) 08/03/2019 1511   TRIG 132 08/03/2019 1511   HDL 47 08/03/2019 1511   CHOLHDL 5.0 (H) 08/03/2019 1511   LDLCALC 165 (H) 08/03/2019 1511   LDLDIRECT 163 (H) 08/03/2019 1511    Physical Exam:    VS:  BP 132/78   Pulse 69   Ht 5\' 7"  (1.702 m)   Wt 176 lb 6.4 oz (80 kg)   SpO2 97%   BMI 27.63 kg/m     Wt Readings from Last 3 Encounters:  04/08/23 176 lb 6.4 oz (80 kg)  01/27/22 179 lb 9.6 oz (81.5 kg)  05/07/21 179 lb (81.2 kg)     GEN: Patient is in no acute distress HEENT: Normal NECK: No JVD; No carotid bruits LYMPHATICS: No lymphadenopathy CARDIAC: Hear sounds regular, 2/6 systolic murmur at the apex. RESPIRATORY:  Clear to auscultation without rales, wheezing or rhonchi  ABDOMEN: Soft, non-tender, non-distended MUSCULOSKELETAL:  No edema; No deformity  SKIN: Warm and dry NEUROLOGIC:  Alert and oriented x 3 PSYCHIATRIC:  Normal affect   Signed, Garwin Brothers, MD  04/08/2023 3:42 PM    [facility]

## 2023-04-09 ENCOUNTER — Telehealth: Payer: Self-pay

## 2023-04-09 LAB — COMPREHENSIVE METABOLIC PANEL
ALT: 21 IU/L (ref 0–32)
AST: 25 IU/L (ref 0–40)
Albumin: 4.3 g/dL (ref 3.8–4.8)
Alkaline Phosphatase: 63 IU/L (ref 44–121)
BUN/Creatinine Ratio: 14 (ref 12–28)
BUN: 16 mg/dL (ref 8–27)
Bilirubin Total: 0.4 mg/dL (ref 0.0–1.2)
CO2: 25 mmol/L (ref 20–29)
Calcium: 9.5 mg/dL (ref 8.7–10.3)
Chloride: 101 mmol/L (ref 96–106)
Creatinine, Ser: 1.13 mg/dL — ABNORMAL HIGH (ref 0.57–1.00)
Globulin, Total: 2.5 g/dL (ref 1.5–4.5)
Glucose: 91 mg/dL (ref 70–99)
Potassium: 4.6 mmol/L (ref 3.5–5.2)
Sodium: 142 mmol/L (ref 134–144)
Total Protein: 6.8 g/dL (ref 6.0–8.5)
eGFR: 50 mL/min/{1.73_m2} — ABNORMAL LOW (ref >59)

## 2023-04-09 MED ORDER — ROSUVASTATIN CALCIUM 10 MG PO TABS: 10.0000 mg | ORAL_TABLET | Freq: Every day | ORAL | 3 refills | Status: AC

## 2023-04-09 MED ORDER — METOPROLOL SUCCINATE ER 25 MG PO TB24: 25.0000 mg | ORAL_TABLET | Freq: Every day | ORAL | 3 refills | Status: DC

## 2023-04-09 NOTE — Telephone Encounter (Signed)
-----   Message from [place] R Revankar sent at 04/09/2023 10:01 AM EDT ----- Diet, rosuvastatin 10 mg daily and liver lipid check in 6 weeks.  Copy primary Garwin Brothers, MD 04/09/2023 10:01 AM

## 2023-05-04 ENCOUNTER — Telehealth: Payer: Self-pay

## 2023-05-04 NOTE — Patient Outreach (Signed)
  Care Coordination   05/04/2023 Name: AKSHITA RUBENZER MRN: [id number] DOB: 14-Jun-1946   Care Coordination Outreach Attempts:  An unsuccessful telephone outreach was attempted today to offer the patient information about available care coordination services.  Follow Up Plan:  Additional outreach attempts will be made to offer the patient care coordination information and services.   Encounter Outcome:  No Answer   Care Coordination Interventions:  No, not indicated    Rowe Pavy, RN, BSN, [facility] [employer] [phone]

## 2023-05-04 NOTE — Patient Outreach (Signed)
  Care Coordination   Initial Visit Note   05/04/2023 Name: Kristen Holden MRN: [id number] DOB: September 03, 1946  Kristen Holden is a 77 y.o. year old female who sees Moon, Amy A, NP for primary care. I spoke with  Kristen Holden by phone today.  What matters to the patients health and wellness today?  Placed call to patient to review and offer [facility] care coordination program.  Patient reports to me that she is doing well at self management.  Denies any current needs at this time.    SDOH assessments and interventions completed:  No     Care Coordination Interventions:  No, not indicated   Follow up plan: No further intervention required.   Encounter Outcome:  Pt. Refused   Kristen Pavy, RN, BSN, [facility] [employer] [phone]

## 2023-05-21 IMAGING — MG DIGITAL SCREENING BREAST BILAT IMPLANT W/ TOMO W/ CAD
9 of 12 series · 9 of 28 positions shown · non-contrast
Comparison: Previous exam(s).

CLINICAL DATA: Screening.

EXAM:
DIGITAL SCREENING BILATERAL MAMMOGRAM WITH IMPLANTS, CAD AND
TOMOSYNTHESIS
TECHNIQUE: Bilateral screening digital craniocaudal and mediolateral oblique
mammograms were obtained. Bilateral screening digital breast
tomosynthesis was performed. The images were evaluated with
computer-aided detection. Standard and/or implant displaced views
were performed.

[R CC]
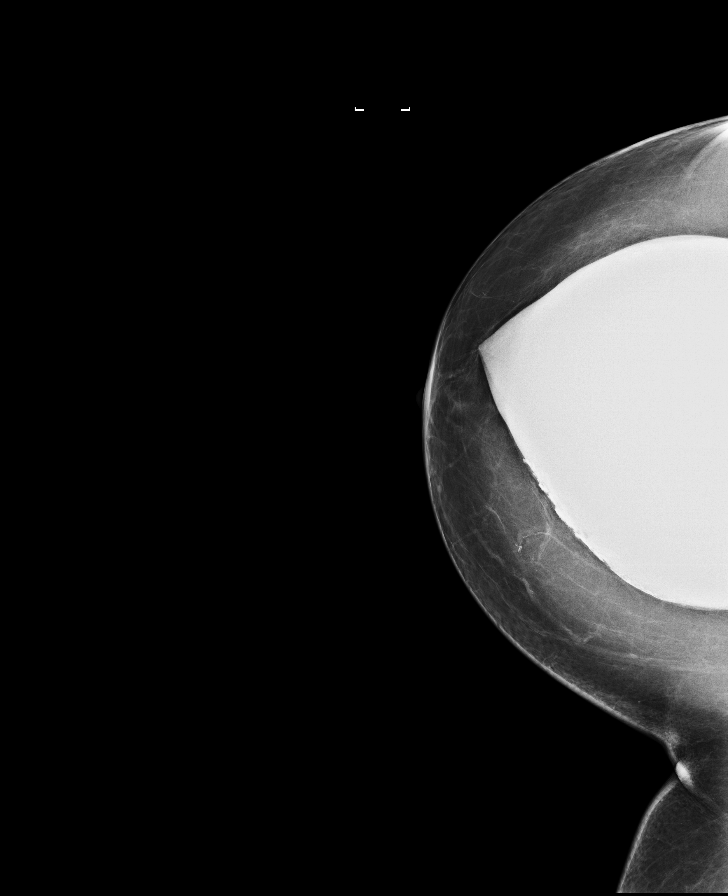

[L CC]
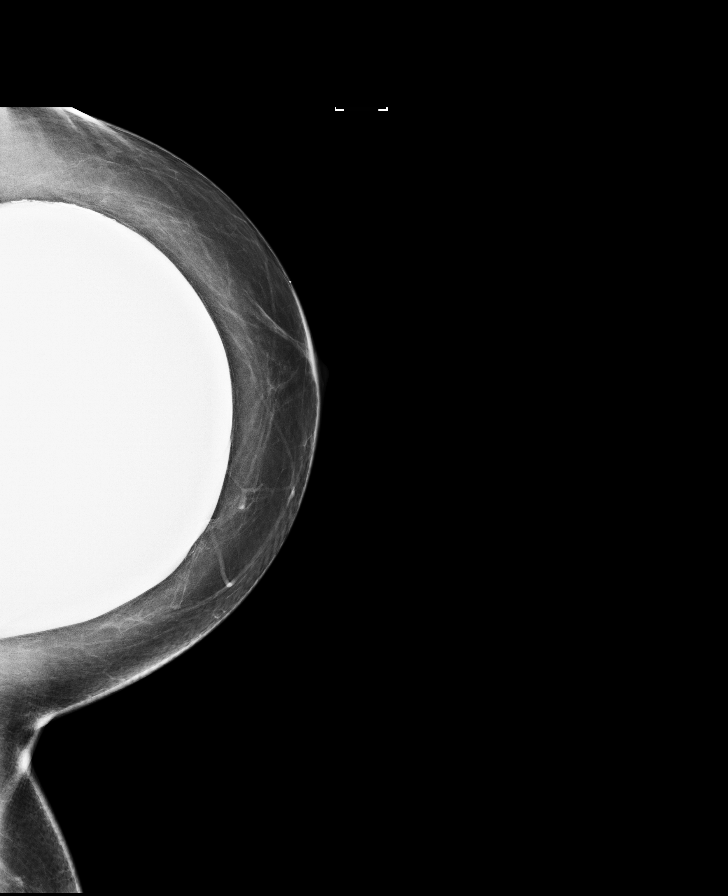

[R MLO]
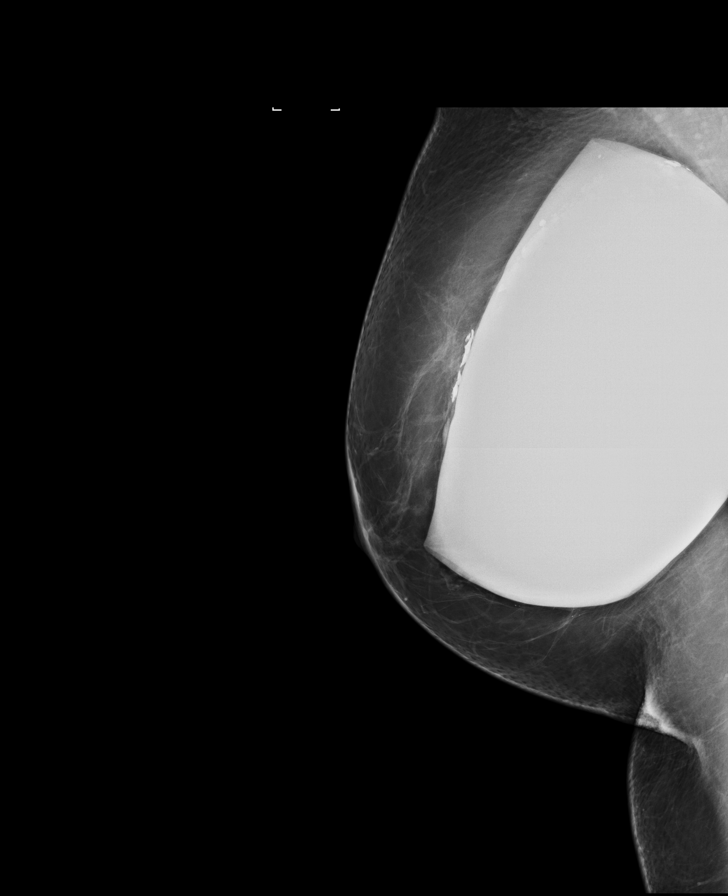

[L MLO]
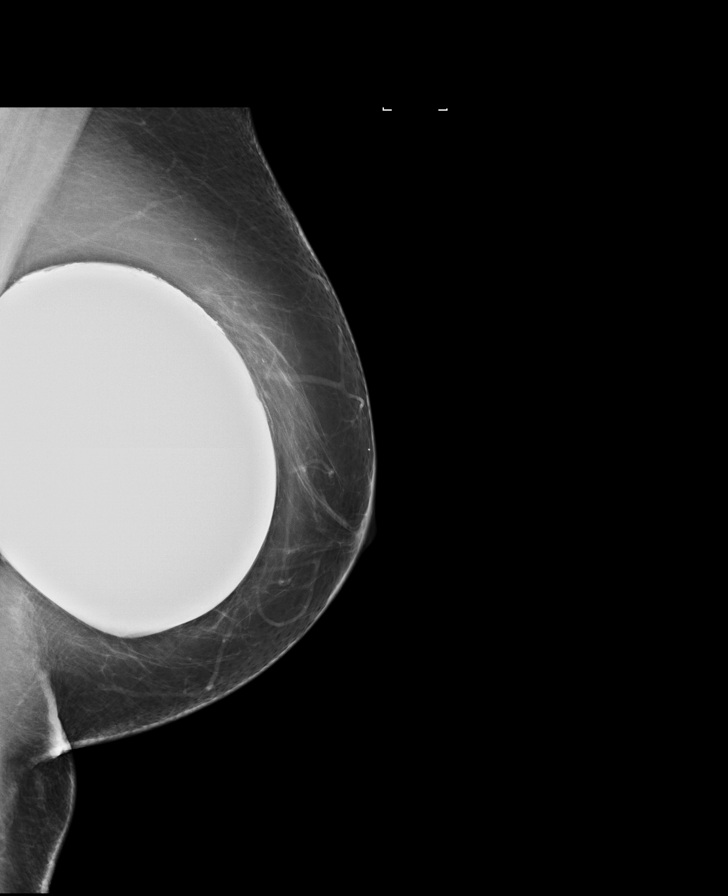

[R MLO synth-2D]
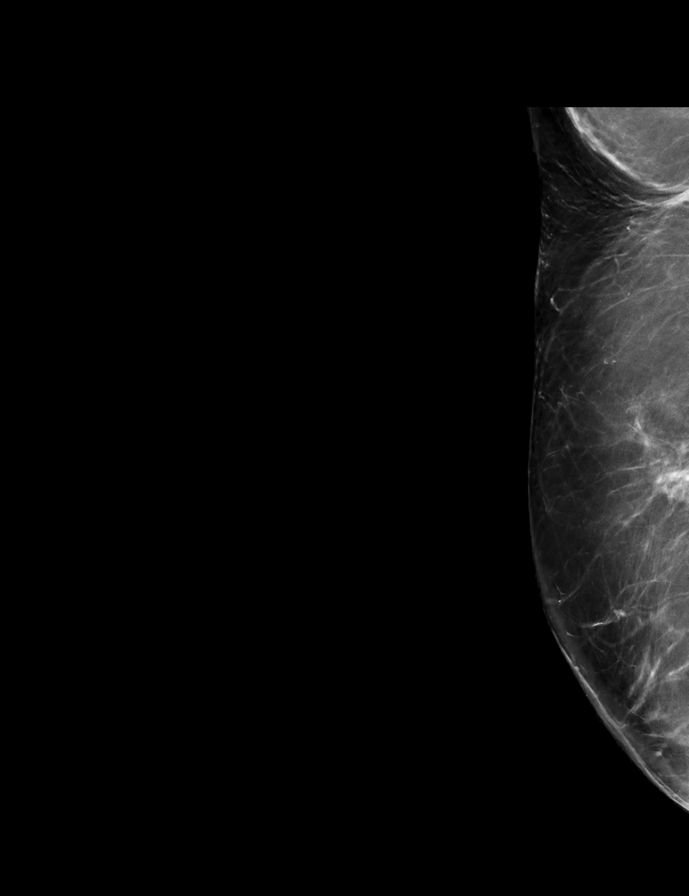

[L CC synth-2D]
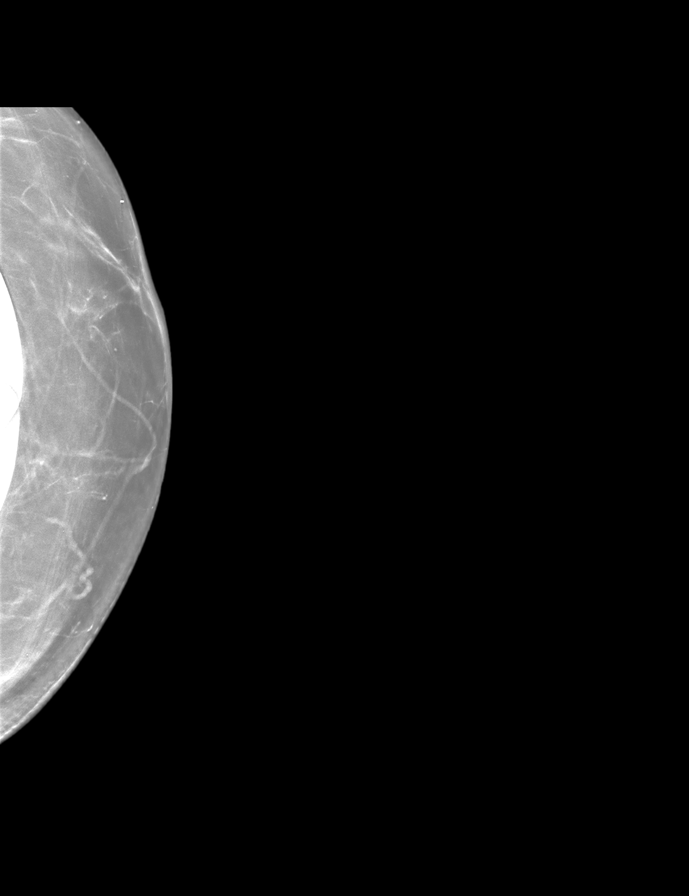

[R CC synth-2D]
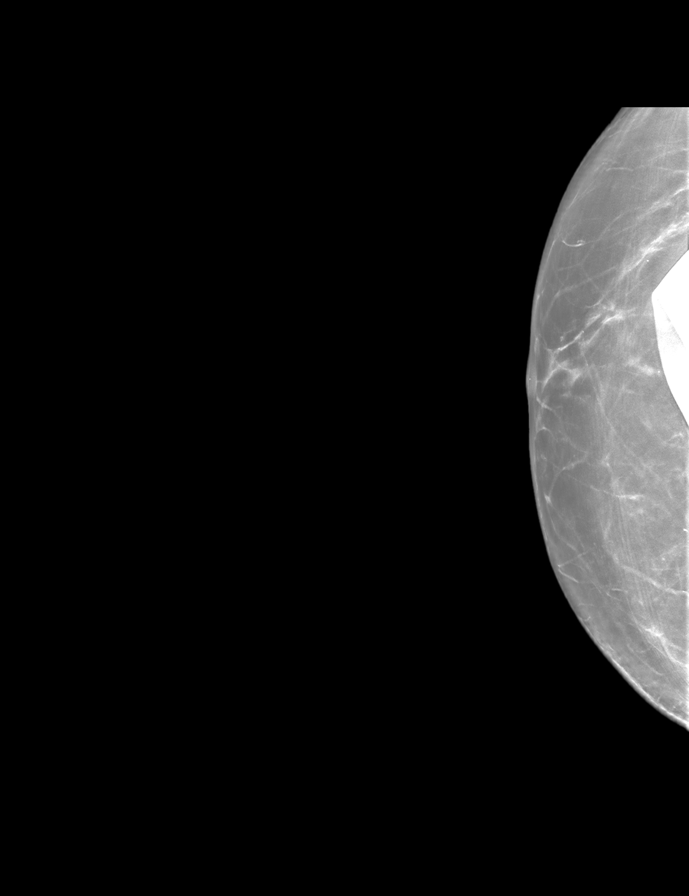

[L MLO synth-2D]
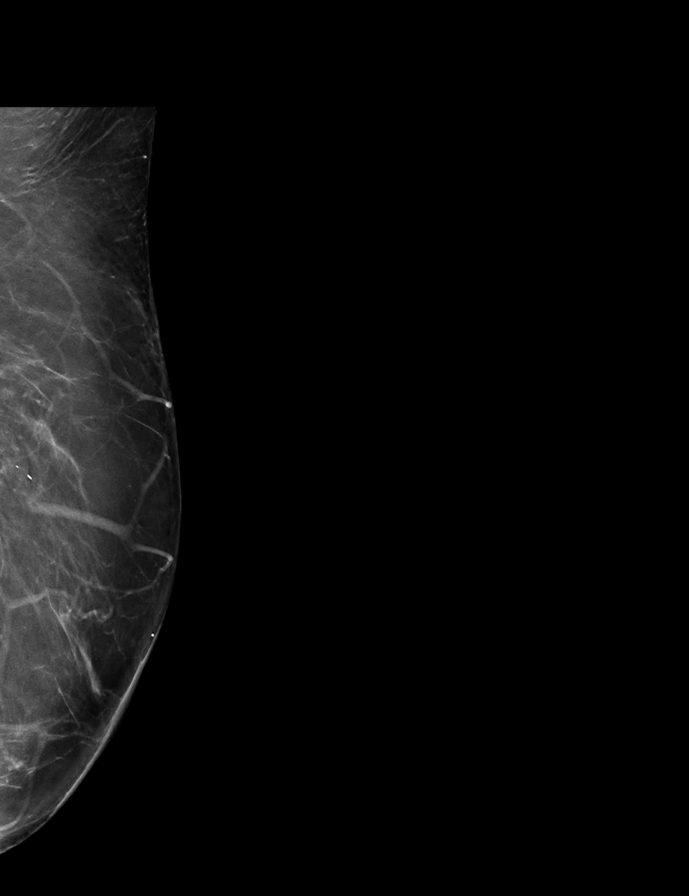

[R CCID BREAST TOMOSYNTHESIS IMAGE tomo · tomo slice 32/63.0]
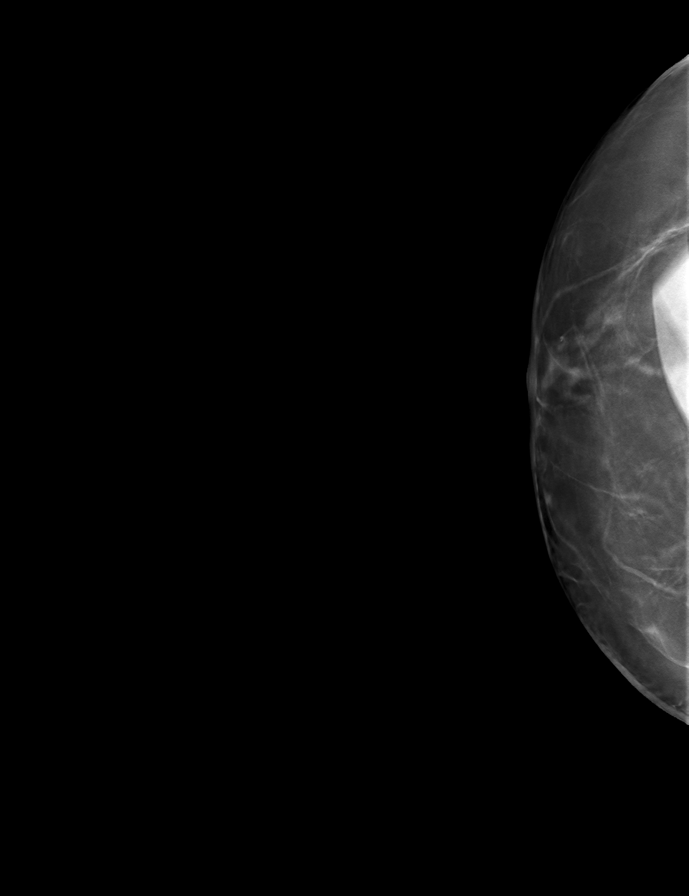

[9 of 28 positions shown; findings below may reference images not displayed]

ACR Breast Density Category b: There are scattered areas of
fibroglandular density.
FINDINGS: The patient has implants. There are no findings suspicious for
malignancy.
IMPRESSION: No mammographic evidence of malignancy. A result letter of this
screening mammogram will be mailed directly to the patient.

RECOMMENDATION:
Screening mammogram in one year. (Code:3J-K-2IG)

BI-RADS CATEGORY  1:  Negative.

## 2023-05-28 DIAGNOSIS — G4489 Other headache syndrome: Secondary | ICD-10-CM | POA: Diagnosis not present

## 2023-05-28 DIAGNOSIS — R062 Wheezing: Secondary | ICD-10-CM | POA: Diagnosis not present

## 2023-05-28 DIAGNOSIS — R5383 Other fatigue: Secondary | ICD-10-CM | POA: Diagnosis not present

## 2023-05-28 DIAGNOSIS — J209 Acute bronchitis, unspecified: Secondary | ICD-10-CM | POA: Diagnosis not present

## 2023-08-11 ENCOUNTER — Inpatient Hospital Stay: Admission: RE | Admit: 2023-08-11 | Payer: Medicare Other | Source: Ambulatory Visit

## 2023-08-24 DIAGNOSIS — Z9181 History of falling: Secondary | ICD-10-CM | POA: Diagnosis not present

## 2023-08-24 DIAGNOSIS — I10 Essential (primary) hypertension: Secondary | ICD-10-CM | POA: Diagnosis not present

## 2023-08-24 DIAGNOSIS — E039 Hypothyroidism, unspecified: Secondary | ICD-10-CM | POA: Diagnosis not present

## 2023-08-24 DIAGNOSIS — Z6827 Body mass index (BMI) 27.0-27.9, adult: Secondary | ICD-10-CM | POA: Diagnosis not present

## 2023-08-24 DIAGNOSIS — E559 Vitamin D deficiency, unspecified: Secondary | ICD-10-CM | POA: Diagnosis not present

## 2023-08-24 DIAGNOSIS — I700 Atherosclerosis of aorta: Secondary | ICD-10-CM | POA: Diagnosis not present

## 2023-08-24 DIAGNOSIS — E785 Hyperlipidemia, unspecified: Secondary | ICD-10-CM | POA: Diagnosis not present

## 2023-08-24 DIAGNOSIS — Z23 Encounter for immunization: Secondary | ICD-10-CM | POA: Diagnosis not present

## 2023-08-24 DIAGNOSIS — Z79899 Other long term (current) drug therapy: Secondary | ICD-10-CM | POA: Diagnosis not present

## 2023-08-24 DIAGNOSIS — K219 Gastro-esophageal reflux disease without esophagitis: Secondary | ICD-10-CM | POA: Diagnosis not present

## 2023-08-24 DIAGNOSIS — F411 Generalized anxiety disorder: Secondary | ICD-10-CM | POA: Diagnosis not present

## 2023-08-24 DIAGNOSIS — J309 Allergic rhinitis, unspecified: Secondary | ICD-10-CM | POA: Diagnosis not present

## 2023-12-28 DIAGNOSIS — M898X6 Other specified disorders of bone, lower leg: Secondary | ICD-10-CM | POA: Diagnosis not present

## 2024-02-02 DIAGNOSIS — Z0000 Encounter for general adult medical examination without abnormal findings: Secondary | ICD-10-CM | POA: Diagnosis not present

## 2024-02-02 DIAGNOSIS — Z9181 History of falling: Secondary | ICD-10-CM | POA: Diagnosis not present

## 2024-04-21 ENCOUNTER — Other Ambulatory Visit: Payer: Self-pay | Admitting: Cardiology

## 2024-06-27 DIAGNOSIS — S60221A Contusion of right hand, initial encounter: Secondary | ICD-10-CM | POA: Diagnosis not present

## 2024-06-27 DIAGNOSIS — Z79899 Other long term (current) drug therapy: Secondary | ICD-10-CM | POA: Diagnosis not present

## 2024-06-27 DIAGNOSIS — Z23 Encounter for immunization: Secondary | ICD-10-CM | POA: Diagnosis not present

## 2024-06-27 DIAGNOSIS — I10 Essential (primary) hypertension: Secondary | ICD-10-CM | POA: Diagnosis not present

## 2024-06-27 DIAGNOSIS — E785 Hyperlipidemia, unspecified: Secondary | ICD-10-CM | POA: Diagnosis not present

## 2024-06-27 DIAGNOSIS — K219 Gastro-esophageal reflux disease without esophagitis: Secondary | ICD-10-CM | POA: Diagnosis not present

## 2024-06-27 DIAGNOSIS — F411 Generalized anxiety disorder: Secondary | ICD-10-CM | POA: Diagnosis not present

## 2024-06-27 DIAGNOSIS — E559 Vitamin D deficiency, unspecified: Secondary | ICD-10-CM | POA: Diagnosis not present

## 2024-06-27 DIAGNOSIS — E039 Hypothyroidism, unspecified: Secondary | ICD-10-CM | POA: Diagnosis not present

## 2024-06-27 DIAGNOSIS — H608X2 Other otitis externa, left ear: Secondary | ICD-10-CM | POA: Diagnosis not present
